# Patient Record
Sex: Female | Born: 1975 | Race: Black or African American | Hispanic: No | Marital: Married | Smoking: Never smoker
Health system: Southern US, Community
[De-identification: ages and names within clinical notes are randomized; demographics above are authoritative.]

## PROBLEM LIST (undated history)

## (undated) DIAGNOSIS — R030 Elevated blood-pressure reading, without diagnosis of hypertension: Secondary | ICD-10-CM

## (undated) DIAGNOSIS — Z8619 Personal history of other infectious and parasitic diseases: Secondary | ICD-10-CM

## (undated) DIAGNOSIS — Z974 Presence of external hearing-aid: Secondary | ICD-10-CM

## (undated) DIAGNOSIS — E559 Vitamin D deficiency, unspecified: Secondary | ICD-10-CM

## (undated) DIAGNOSIS — Z8719 Personal history of other diseases of the digestive system: Secondary | ICD-10-CM

## (undated) DIAGNOSIS — H9190 Unspecified hearing loss, unspecified ear: Secondary | ICD-10-CM

## (undated) DIAGNOSIS — T7840XA Allergy, unspecified, initial encounter: Secondary | ICD-10-CM

## (undated) DIAGNOSIS — K5909 Other constipation: Secondary | ICD-10-CM

## (undated) DIAGNOSIS — Z87442 Personal history of urinary calculi: Secondary | ICD-10-CM

## (undated) DIAGNOSIS — I10 Essential (primary) hypertension: Secondary | ICD-10-CM

## (undated) DIAGNOSIS — N3281 Overactive bladder: Secondary | ICD-10-CM

## (undated) DIAGNOSIS — K5730 Diverticulosis of large intestine without perforation or abscess without bleeding: Secondary | ICD-10-CM

## (undated) DIAGNOSIS — N939 Abnormal uterine and vaginal bleeding, unspecified: Secondary | ICD-10-CM

## (undated) DIAGNOSIS — D250 Submucous leiomyoma of uterus: Secondary | ICD-10-CM

## (undated) DIAGNOSIS — D509 Iron deficiency anemia, unspecified: Secondary | ICD-10-CM

## (undated) HISTORY — DX: Allergy, unspecified, initial encounter: T78.40XA

## (undated) HISTORY — DX: Personal history of other infectious and parasitic diseases: Z86.19

## (undated) HISTORY — DX: Vitamin D deficiency, unspecified: E55.9

## (undated) HISTORY — DX: Elevated blood-pressure reading, without diagnosis of hypertension: R03.0

## (undated) HISTORY — DX: Unspecified hearing loss, unspecified ear: H91.90

---

## 1997-12-01 ENCOUNTER — Encounter: Admission: RE | Admit: 1997-12-01 | Discharge: 1998-03-01 | Payer: Self-pay | Admitting: Gynecology

## 1998-06-08 ENCOUNTER — Inpatient Hospital Stay (HOSPITAL_COMMUNITY): Admission: AD | Admit: 1998-06-08 | Discharge: 1998-06-12 | Payer: Self-pay | Admitting: Gynecology

## 1999-11-09 ENCOUNTER — Other Ambulatory Visit: Admission: RE | Admit: 1999-11-09 | Discharge: 1999-11-09 | Payer: Self-pay | Admitting: Gynecology

## 2000-07-07 ENCOUNTER — Other Ambulatory Visit: Admission: RE | Admit: 2000-07-07 | Discharge: 2000-07-07 | Payer: Self-pay | Admitting: Internal Medicine

## 2002-07-23 ENCOUNTER — Other Ambulatory Visit: Admission: RE | Admit: 2002-07-23 | Discharge: 2002-07-23 | Payer: Self-pay | Admitting: Obstetrics and Gynecology

## 2004-10-04 ENCOUNTER — Other Ambulatory Visit: Admission: RE | Admit: 2004-10-04 | Discharge: 2004-10-04 | Payer: Self-pay | Admitting: Obstetrics and Gynecology

## 2005-03-20 ENCOUNTER — Inpatient Hospital Stay (HOSPITAL_COMMUNITY): Admission: AD | Admit: 2005-03-20 | Discharge: 2005-03-20 | Payer: Self-pay | Admitting: Obstetrics and Gynecology

## 2005-04-15 ENCOUNTER — Inpatient Hospital Stay (HOSPITAL_COMMUNITY): Admission: RE | Admit: 2005-04-15 | Discharge: 2005-04-18 | Payer: Self-pay | Admitting: Obstetrics and Gynecology

## 2005-04-15 ENCOUNTER — Encounter (INDEPENDENT_AMBULATORY_CARE_PROVIDER_SITE_OTHER): Payer: Self-pay | Admitting: Specialist

## 2005-05-12 ENCOUNTER — Inpatient Hospital Stay (HOSPITAL_COMMUNITY): Admission: AD | Admit: 2005-05-12 | Discharge: 2005-05-12 | Payer: Self-pay | Admitting: Obstetrics and Gynecology

## 2009-01-19 ENCOUNTER — Encounter: Admission: RE | Admit: 2009-01-19 | Discharge: 2009-04-06 | Payer: Self-pay | Admitting: Occupational Medicine

## 2010-04-02 LAB — HM PAP SMEAR: HM Pap smear: NORMAL

## 2011-01-18 NOTE — Discharge Summary (Signed)
Michele, [name]              ACCOUNT NO.:  [id number]   MEDICAL RECORD NO.:  [id number]          PATIENT TYPE:  INP   LOCATION:  9132                          FACILITY:  [facility]   PHYSICIAN:  Hal Morales, M.D.DATE OF BIRTH:  [birth year]   DATE OF ADMISSION:  04/15/2005  DATE OF DISCHARGE:  04/18/2005                                 DISCHARGE SUMMARY   ADMITTING DIAGNOSES:  1.  Intrauterine pregnancy at 39 weeks.  2.  Elective repeat cesarean section.   PROCEDURE:  Repeat cesarean section.   DISCHARGE DIAGNOSES:  1.  Intrauterine pregnancy at term, delivered.  2.  Repeat low transverse cesarean section.   HOSPITAL COURSE:  [name] is a 35 year old gravida 2, para 1-0-0-1 at 50  weeks' gestation who presented for repeat C-section.  This was done on  August 14, 2006m with Dr. Osborn Coho as the surgeon.  The patient gave  birth to a 6 pound 10 ounce female infant with Apgar scores of 9 at one  minute and 9 at five minutes.  The patient and infant have done well in the  immediate postpartum.  The patient's vital signs remained stable.  Her  hemoglobin on the first postoperative day was 9.8.  Her incision is clean,  dry and intact, and on this her third postoperative day she is judged to be  in satisfactory condition for discharge.   DISCHARGE INSTRUCTIONS:  Per [facility] handout.  The patient will  make a follow up appointment at [facility]  6 weeks and plans to have IUD inserted  at 3 months postpartum.   DISCHARGE MEDICATIONS:  1.  Motrin 600 mg p.o. q.6 h p.r.n. pain.  2.  Tylox one to two p.o. q.3-4 h pain.  3.  Prenatal vitamins.      [name], C.N.M.      Hal Morales, M.D.  Electronically Signed    SDM/MEDQ  D:  04/18/2005  T:  04/18/2005  Job:  [id number]

## 2011-01-18 NOTE — Op Note (Signed)
NAMESHERRICA, Michele [name]              ACCOUNT NO.:  [id number]   MEDICAL RECORD NO.:  [id number]          PATIENT TYPE:  INP   LOCATION:  9199                          FACILITY:  [facility]   PHYSICIAN:  Osborn Coho, M.D.   DATE OF BIRTH:  20-Mar-1976   DATE OF PROCEDURE:  04/15/2005  DATE OF DISCHARGE:                                 OPERATIVE REPORT   PREOPERATIVE DIAGNOSES:  1.  39 week intrauterine pregnancy.  2.  Elective repeat C section.   POSTOPERATIVE DIAGNOSES:  1.  39 week intrauterine pregnancy.  2.  Elective repeat C section.   PROCEDURE:  Repeat C section via Pfannenstiel skin incision.   SURGEON:  Osborn Coho, M.D.   ASSISTANT:  Elby Showers. Williams, C.N.M.   ANESTHESIA:  Spinal.   SPECIMENS TO PATHOLOGY:  Placenta.   ESTIMATED BLOOD LOSS:  700 mL.   FLUIDS:  3000 mL.   URINE OUTPUT:  200 mL.   COMPLICATIONS:  None.   FINDINGS:  Live female infant with Apgar's of 9 at 1 minute and 9 at 5  minutes, normal appearing bilateral ovaries and fallopian tubes. Three  vessel cord.   DESCRIPTION OF PROCEDURE:  The patient was taken to the operating room after  the risks, benefits, and alternatives were reviewed with the patient and the  patient verbalized understanding, consent signed and witnessed. The patient  was prepped and draped in the normal sterile fashion in the dorsal supine  position with a leftward tilt. A Pfannenstiel skin incision was made at the  site of the prior scar and carried down to the underlying layer of fascia  with the scalpel. The fascia was __________ bilaterally in the midline and  extended bilaterally with the Mayo scissors. Kocher clamps were placed on  the inferior aspect of the fascial incision and the rectus muscle excised  from the fascia. The same was done on the superior aspect of the fascial  incision. The midline was noted with dense adhesions and was taken down  sequentially by tenting up with the hemostatic and excising with the  Bovie  and Mayo scissors. Kocher clamps were placed on the superior aspect of the  fascial incision where the rectus muscle is taken down from the fascia with  dense adhesions noted. The peritoneum was entered with the Metzenbaum  scissors after tenting up. The peritoneum was extended manually. The bladder  blade was placed and a bladder flap created with the Metzenbaum scissors.  The uterine incision was made with the scalpel and extended bilaterally with  the bandage scissors. The infant was delivered and oral and nasal pharynx  bulb suctioned after cord clamped and cut. Clindamycin was administered  secondary to penicillin allergy. The infant was handed to the waiting  pediatricians. The placenta was removed via fundal massage and the uterus  was cleared of all clots and debris. The uterine incision was repaired with  #0 Vicryl in a running locked fashion and a second imbricating layer was  performed. The intraabdominal cavity was copiously irrigated and the adnexal  findings as noted above. The fascia was repaired with #0  Vicryl in a running  fashion. There is an area on the fascia near the umbilicus that appeared  weak and a running stitch of #0 Vicryl was placed for better support. The  subcutaneous tissue was irrigated and reapproximated using 2-0 plain. The  skin was closed with staples. Sponge, lap and needle count was correct. The  patient tolerated the procedure well and is currently being transferred to  the recovery room in good condition.      Osborn Coho, M.D.  Electronically Signed     AR/MEDQ  D:  04/15/2005  T:  04/15/2005  Job:  [id number]

## 2011-01-18 NOTE — H&P (Signed)
[name], [name] NO.:  [id number]   MEDICAL RECORD NO.:  [id number]          PATIENT TYPE:  INP   LOCATION:  NA                            [facility]ILITY:  [facility]   PHYSICIAN:  Osborn Coho, M.D.   DATE OF BIRTH:  [birth year]   DATE OF ADMISSION:  DATE OF DISCHARGE:                                HISTORY & PHYSICAL   HISTORY OF PRESENT ILLNESS:  This is a 35 year old gravida 2, para 1-0-0-1,  at 39-0/7 weeks, who presents for repeat C-section today.  Her pregnancy has  been followed by Dr. Su Hilt and remarkable for:  #1 - Previous C-section,  #2 - penicillin allergy, #3 - ptyalism, #4 - history of anemia.   OBSTETRICAL HISTORY:  OB history is remarkable for low transverse cesarean  section in 1999, normal female infant at 28 weeks' gestation weighing 8  pounds 5 ounces, remarkable for failure to progress and fetal distress.   MEDICAL HISTORY:  Medical history is remarkable for childhood varicella and  history of anemia.   FAMILY HISTORY:  Family history is remarkable for a grandmother with heart  disease, father and mother with hypertension, mother with anemia,  grandmother with diabetes, grandmother with thyroid disease, grandfather  with prostate cancer.   GENETIC HISTORY:  Genetic history is unremarkable, except for a family  history of twins.   SOCIAL HISTORY:  The patient is single.  Father of the baby is Michele [name],  who is involved and supportive.  She does not report a religious  affiliation.  She denies any alcohol, tobacco or drug use.   ALLERGIES:  PENICILLIN causes a rash and swollen tongue.   MEDICATIONS:  Prenatal vitamins.   PERTINENT LABORATORY DATA:  Hemoglobin 11.1, platelets 352,000.  Blood type  O positive, antibody screen negative, sickle cell negative.  RPR  nonreactive.  Rubella immune.  Hepatitis negative.  HIV declined.  Cystic  fibrosis negative.   HISTORY OF CURRENT PREGNANCY:  The patient entered care at 66 weeks'  gestation.  New OB labs were normal.  She had some proteinuria at 15 weeks  and had a 24-hour urine done which showed only 56 mg of protein.  She had an  anatomy scan at 19 weeks which was normal except for they were unable to see  all of the anatomy, and she had another one at 21 weeks which was normal.  She declined her quad screen.  Glucola was normal and the remainder of her  pregnancy was uneventful including a negative group B strep at term.   OBJECTIVE DATA:  VITAL SIGNS:  Stable, afebrile.  HEENT:  Within normal limits.  NECK:  Thyroid normal, not enlarged.  CHEST:  Clear to auscultation.  HEART:  Heart rate regular rate and rhythm.  ABDOMEN:  Gravid at 39 cm, vertex to Leopold's.  Fetal heart rate 150s.  PELVIC:  Exam deferred.  EXTREMITIES:  Within normal limits.   ASSESSMENT:  1.  Intrauterine pregnancy at 39 weeks.  2.  Previous cesarean section.  3.  Desires repeat cesarean section.   PLAN:  Admit to operating suites per  Dr. Su Hilt.      Michele [name], C.N.M.      Osborn Coho, M.D.  Electronically Signed    MLW/MEDQ  D:  04/15/2005  T:  04/15/2005  Job:  [id number]

## 2011-03-27 ENCOUNTER — Ambulatory Visit: Payer: Self-pay

## 2011-10-07 ENCOUNTER — Ambulatory Visit (INDEPENDENT_AMBULATORY_CARE_PROVIDER_SITE_OTHER): Payer: Federal, State, Local not specified - PPO | Admitting: Family

## 2011-10-07 ENCOUNTER — Other Ambulatory Visit: Payer: Self-pay | Admitting: Family

## 2011-10-07 ENCOUNTER — Encounter: Payer: Self-pay | Admitting: Family

## 2011-10-07 VITALS — HR 83 | Temp 97.6°F | Resp 18 | Ht 61.0 in | Wt 205.1 lb

## 2011-10-07 DIAGNOSIS — J329 Chronic sinusitis, unspecified: Secondary | ICD-10-CM

## 2011-10-07 DIAGNOSIS — R61 Generalized hyperhidrosis: Secondary | ICD-10-CM

## 2011-10-07 DIAGNOSIS — R635 Abnormal weight gain: Secondary | ICD-10-CM

## 2011-10-07 DIAGNOSIS — Z0000 Encounter for general adult medical examination without abnormal findings: Secondary | ICD-10-CM

## 2011-10-07 DIAGNOSIS — R030 Elevated blood-pressure reading, without diagnosis of hypertension: Secondary | ICD-10-CM

## 2011-10-07 LAB — CBC
Hemoglobin: 10.6 g/dL — ABNORMAL LOW (ref 12.0–15.0)
MCH: 27.5 pg (ref 26.0–34.0)
MCHC: 31.7 g/dL (ref 30.0–36.0)
MCV: 86.5 fL (ref 78.0–100.0)

## 2011-10-07 MED ORDER — LEVOFLOXACIN 500 MG PO TABS: 500.0000 mg | ORAL_TABLET | Freq: Every day | ORAL | Status: AC

## 2011-10-07 NOTE — Assessment & Plan Note (Signed)
36 yr old female with symptoms consistent with sinusitis. Will plan to treat with levaquin as she has an allergy to penicillin.

## 2011-10-07 NOTE — Assessment & Plan Note (Signed)
New x 6 months, obtain TSH. She reports regular menses.

## 2011-10-07 NOTE — Progress Notes (Signed)
Subjective:    Patient ID: Michele [name], female    DOB: 01/19/1976, 36 y.o.   MRN: [id number]  HPI  Michele [name] is a 36 yr old female who presents today to establish care. She has followed with [facility].   Elevated blood pressure-  She reports that her BP was elevated early January.  She reports that her SBP has been averaging 136 at work.    HA- started 2 weeks ago.  "pressure behind her eyeballs, across forehead." She reports that symptoms are intermittent.  Throbbing.   Denies nasal congestion.  Night sweats- she has gained 20 pounds this year.  Started 3rd shift.  She joined the gym last week at work.  Wants to exercise 3 nights a week on her lunch break.  She reports that this has been going on for 6 months.  She reports that she has mirena IUD.  She reports that the last 2 yrs has been regular.   Weight gain- has gained 20 pounds the last 1 yr.  Review of Systems  Constitutional: Positive for unexpected weight change.  HENT: Negative for hearing loss.   Eyes: Negative for visual disturbance.  Respiratory: Negative for cough and shortness of breath.   Cardiovascular: Negative for chest pain and leg swelling.  Gastrointestinal: Negative for nausea, vomiting and diarrhea.  Genitourinary: Negative for menstrual problem.  Musculoskeletal: Negative for myalgias and arthralgias.  Skin: Negative for rash.  Neurological: Positive for headaches.  Hematological: Negative for adenopathy.  Psychiatric/Behavioral:       Felt claustraphobic last week while on airplane.    Past Medical History  Diagnosis Date  . Allergy   . Elevated blood pressure reading without diagnosis of hypertension     History   Social History  . Marital Status: Married    Spouse Name: N/A    Number of Children: 2  . Years of Education: N/A   Occupational History  .     Social History Main Topics  . Smoking status: Never Smoker   . Smokeless tobacco: Never Used  . Alcohol Use: Yes     1 drink  every other week  . Drug Use: No  . Sexually Active: Not on file   Other Topics Concern  . 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    Past Surgical History  Procedure Date  . Cesarean section 06/09/98 & 04/15/05    x 2    Family History  Problem Relation Age of Onset  . Hypertension Mother   . Hypertension Father   . Hypertension Sister   . Hypertension Maternal Grandmother   . Diabetes Maternal Grandmother   . Hypertension Paternal Grandmother   . Hypertension Paternal Grandfather     Allergies  Allergen Reactions  . Penicillins Other (See Comments)    Rash/bumps in mouth    No current outpatient prescriptions on file prior to visit.    Pulse 83  Temp(Src) 97.6 F (36.4 C) (Oral)  Resp 18  Ht 5\' 1"  (1.549 m)  Wt 205 lb 1.3 oz (93.024 kg)  BMI 38.75 kg/m2  SpO2 100%  LMP 09/25/2011       Objective:   Physical Exam  Constitutional: She is oriented to person, place, and time.       Pleasant, overweight AA female, NAD  HENT:  Head: Normocephalic and atraumatic.  Right Ear: Tympanic membrane and ear canal normal.  Left Ear: Tympanic membrane and ear canal normal.  Mouth/Throat: No posterior oropharyngeal edema or posterior oropharyngeal erythema.       + frontal sinus tenderness to palpation  Eyes: Conjunctivae are normal. Pupils are equal, round, and reactive to light.  Neck: Normal range of motion. Neck supple. No thyromegaly present.  Cardiovascular: Normal rate and regular rhythm.   No murmur heard. Pulmonary/Chest: Effort normal and breath sounds normal. No respiratory distress. She has no wheezes. She has no rales. She exhibits no tenderness.  Musculoskeletal: She exhibits no edema.  Neurological: She is alert and oriented to person, place, and time.  Skin:  Skin is warm and dry.  Psychiatric: She has a normal mood and affect. Her behavior is normal. Judgment and thought content normal.          Assessment & Plan:

## 2011-10-07 NOTE — Assessment & Plan Note (Signed)
Will obtain TSH.  We discussed exercise and weight loss today.

## 2011-10-07 NOTE — Assessment & Plan Note (Signed)
BP was hard to hear today.  CMA unable to obtain.  I was able to palpate systolic pressure 142 but could not hear diastolic.  Recommended low sodium diet and will plan to repeat in 1 month.

## 2011-10-07 NOTE — Patient Instructions (Signed)
Please complete your blood work today. Call if headache worsens or if it does not improve.  Please follow up in 1 month for a complete physical. Come fasting to this appointment.  Welcome to [facility]!

## 2011-10-08 ENCOUNTER — Encounter: Payer: Self-pay | Admitting: Family

## 2011-10-08 LAB — BASIC METABOLIC PANEL WITH GFR
BUN: 11 mg/dL (ref 6–23)
Calcium: 9.8 mg/dL (ref 8.4–10.5)
Creat: 0.53 mg/dL (ref 0.50–1.10)
GFR, Est African American: >89 mL/min
Glucose, Bld: 81 mg/dL (ref 70–99)
Sodium: 136 mEq/L (ref 135–145)

## 2011-10-08 LAB — FERRITIN: Ferritin: 22 ng/mL (ref 10–291)

## 2011-10-08 LAB — HEPATIC FUNCTION PANEL
ALT: 13 U/L (ref 0–35)
AST: 19 U/L (ref 0–37)
Albumin: 4.4 g/dL (ref 3.5–5.2)
Bilirubin, Direct: 0.1 mg/dL (ref 0.0–0.3)
Total Protein: 7.6 g/dL (ref 6.0–8.3)

## 2011-10-08 LAB — IRON AND TIBC
%SAT: 9 % — ABNORMAL LOW (ref 20–55)
TIBC: 389 ug/dL (ref 250–470)
UIBC: 353 ug/dL (ref 125–400)

## 2011-10-08 LAB — LIPID PANEL
Cholesterol: 171 mg/dL (ref 0–200)
Triglycerides: 45 mg/dL (ref <150)

## 2011-10-08 LAB — FOLATE: Folate: 12 ng/mL

## 2011-10-08 LAB — VITAMIN B12: Vitamin B-12: 358 pg/mL (ref 211–911)

## 2011-10-09 ENCOUNTER — Telehealth: Payer: Self-pay | Admitting: Family

## 2011-10-09 MED ORDER — FERROUS SULFATE 325 (65 FE) MG PO TABS: 325.0000 mg | ORAL_TABLET | Freq: Every day | ORAL | Status: DC

## 2011-10-09 NOTE — Telephone Encounter (Signed)
Left detailed message on voicemail and to call if any questions. 

## 2011-10-09 NOTE — Telephone Encounter (Signed)
Please call patient and let her know that she is anemic and iron is low.  I would like her to add iron supplement as noted in meds. Other labs are ok.

## 2011-10-29 ENCOUNTER — Ambulatory Visit: Payer: Self-pay | Admitting: Registered Nurse

## 2011-11-04 ENCOUNTER — Ambulatory Visit: Payer: Federal, State, Local not specified - PPO | Admitting: Family

## 2012-05-15 ENCOUNTER — Telehealth: Payer: Self-pay | Admitting: Obstetrics and Gynecology

## 2012-05-15 NOTE — Telephone Encounter (Signed)
TC to pt. States has had vaginal burning and itching x 1 week.  Also has numbness on left hip when she presses it. States had same sx previously with BV. Denies UTI sx. Sched for eval with EP 05/18/12.  To call after hours or be seen at [facility] if sx increase. Pt verbalizes comprehension.

## 2012-05-18 ENCOUNTER — Encounter: Payer: Self-pay | Admitting: Obstetrics and Gynecology

## 2012-05-18 ENCOUNTER — Ambulatory Visit (INDEPENDENT_AMBULATORY_CARE_PROVIDER_SITE_OTHER): Payer: Federal, State, Local not specified - PPO | Admitting: Obstetrics and Gynecology

## 2012-05-18 VITALS — BP 140/80 | Wt 204.0 lb

## 2012-05-18 DIAGNOSIS — N898 Other specified noninflammatory disorders of vagina: Secondary | ICD-10-CM

## 2012-05-18 DIAGNOSIS — N390 Urinary tract infection, site not specified: Secondary | ICD-10-CM

## 2012-05-18 DIAGNOSIS — Z309 Encounter for contraceptive management, unspecified: Secondary | ICD-10-CM

## 2012-05-18 DIAGNOSIS — IMO0001 Reserved for inherently not codable concepts without codable children: Secondary | ICD-10-CM

## 2012-05-18 LAB — POCT URINALYSIS DIPSTICK
Ketones, UA: NEGATIVE
Protein, UA: NEGATIVE
Spec Grav, UA: 1.015
Urobilinogen, UA: NEGATIVE
pH, UA: 7

## 2012-05-18 LAB — POCT WET PREP (WET MOUNT)
Trichomonas Wet Prep HPF POC: NEGATIVE
pH: 4.5

## 2012-05-18 MED ORDER — FLUCONAZOLE 150 MG PO TABS: 150.0000 mg | ORAL_TABLET | Freq: Once | ORAL | Status: DC

## 2012-05-18 MED ORDER — CIPROFLOXACIN 500 MG/5ML (10%) PO SUSR: 500.0000 mg | Freq: Two times a day (BID) | ORAL | Status: DC

## 2012-05-18 NOTE — Patient Instructions (Signed)

## 2012-05-18 NOTE — Progress Notes (Signed)
35 YO complains of vaginitis symptoms and dysuria x 1 week.  Admits to holding urine, not consuming much water and sitting around in wet swim suit. Denies fever, flank pain, nausea, vomiting, vaginal itching or odor.  O: Pelvic: EGBUS-wnl, vagina-small thin white discharge, cervix/uterus-normal,  adnexae-no masses or tenderness      Abdomen: soft, non-tender  Wet Prep:  pH-4.5,  whiff-negative, no clue cells, yeast or trichomonas U/A:   pH-7.0,  SG-1.015,  blood-1+, leukocytes-2+ UPT-negative  A: UTI  P: Bladder hygiene      Cipro Suspension 500 mg/5 ml  #100 mL  5 mL bid         Diflucan 150mg  #1 1 po stat 1 refill (yeast prophalaxis)      RTO-AEx  Jaking Thayer, PA-C

## 2012-05-18 NOTE — Progress Notes (Signed)
Vag. Discharge:YES Odor:no Fever:no Irreg.Periods:no Dyspareunia:no Dysuria:yes Frequency:no Urgency:no Hematuria:no Kidney stones:no Constipation:no Diarrhea:no Rectal Bleeding: no Vomiting:no Nausea:no Pregnant:no Fibroids:no Endometriosis:no Hx of Ovarian Cyst:no Hx IUD:yes Hx STD-PID:no Appendectomy:no Gall Bladder Dz:no

## 2012-05-20 LAB — URINE CULTURE

## 2012-06-05 ENCOUNTER — Encounter: Payer: Federal, State, Local not specified - PPO | Admitting: Obstetrics and Gynecology

## 2012-09-23 ENCOUNTER — Encounter: Payer: Self-pay | Admitting: Obstetrics and Gynecology

## 2012-09-23 ENCOUNTER — Ambulatory Visit: Payer: Federal, State, Local not specified - PPO | Admitting: Obstetrics and Gynecology

## 2012-09-23 ENCOUNTER — Telehealth: Payer: Self-pay | Admitting: Obstetrics and Gynecology

## 2012-09-23 VITALS — BP 140/86 | HR 82 | Ht 61.0 in | Wt 200.0 lb

## 2012-09-23 DIAGNOSIS — N898 Other specified noninflammatory disorders of vagina: Secondary | ICD-10-CM

## 2012-09-23 DIAGNOSIS — Z01419 Encounter for gynecological examination (general) (routine) without abnormal findings: Secondary | ICD-10-CM

## 2012-09-23 DIAGNOSIS — Z113 Encounter for screening for infections with a predominantly sexual mode of transmission: Secondary | ICD-10-CM

## 2012-09-23 DIAGNOSIS — E559 Vitamin D deficiency, unspecified: Secondary | ICD-10-CM

## 2012-09-23 DIAGNOSIS — Z1321 Encounter for screening for nutritional disorder: Secondary | ICD-10-CM

## 2012-09-23 DIAGNOSIS — Z124 Encounter for screening for malignant neoplasm of cervix: Secondary | ICD-10-CM

## 2012-09-23 LAB — POCT WET PREP (WET MOUNT)
Whiff Test: POSITIVE
pH: 5

## 2012-09-23 MED ORDER — ALPRAZOLAM 0.5 MG PO TABS: ORAL_TABLET | ORAL | Status: DC

## 2012-09-23 MED ORDER — FLUCONAZOLE 150 MG PO TABS: 150.0000 mg | ORAL_TABLET | Freq: Once | ORAL | Status: DC

## 2012-09-23 MED ORDER — METRONIDAZOLE 500 MG PO TABS: ORAL_TABLET | ORAL | Status: DC

## 2012-09-23 NOTE — Progress Notes (Signed)
Regular Periods: yes Mammogram: no  Monthly Breast Ex.: yes Exercise: yes  Tetanus < 10 years: yes Seatbelts: yes  NI. Bladder Functn.: yes Abuse at home: no  Daily BM's: yes Stressful Work: no  Healthy Diet: yes Sigmoid-Colonoscopy: ON  Calcium: no Medical problems this year: CHECK FOR YEAST   LAST PAP:2008  Contraception: LO LOESTRIN  Mammogram:  NO  PCP:  DR. Peggyann Juba  PMH: NO CHANGE  FMH: NO CHANGE  Last Bone Scan: NO  PT IS MARRIED.

## 2012-09-23 NOTE — Progress Notes (Signed)
Subjective:    Michele [name] is a 37 y.o. female, G2P0, who presents for an annual exam. The patient reports vaginitis symptoms and wants to be checked for yeast and trichomonas. Has been having some itching with vaginal odor. Patient states that she does not use much salt but has been rushing around this a.m. and is having lots of stress at work.   Menstrual cycle:   LMP: Patient's last menstrual period was 09/07/2012.             Review of Systems Pertinent items are noted in HPI. Denies pelvic pain, urinary tract symptoms, irregular bleeding, menopausal symptoms, change in bowel habits or rectal bleeding   Objective:    BP 140/86  Pulse 82  Ht 5\' 1"  (1.549 m)  Wt 200 lb (90.719 kg)  BMI 37.79 kg/m2  LMP 09/07/2012   Wt Readings from Last 1 Encounters:  09/23/12 200 lb (90.719 kg)   Body mass index is 37.79 kg/(m^2). General Appearance: Alert, no acute distress HEENT: Grossly normal Neck / Thyroid: Supple, no thyromegaly or cervical adenopathy Lungs: Clear to auscultation bilaterally Back: No CVA tenderness Breast Exam: No masses or nodes.No dimpling, nipple retraction or discharge. Cardiovascular: Regular rate and rhythm.  Gastrointestinal: Soft, non-tender, no masses or organomegaly Pelvic Exam: EGBUS-wnl, vagina-normal rugae, copious malodorous creamy discharge, cervix- without lesions or tenderness, uterus appears normal size shape and consistency, adnexae-no masses or tenderness Lymphatic Exam: Non-palpable nodes in neck, clavicular,  axillary, or inguinal regions  Skin: no rashes or abnormalities Extremities: no clubbing cyanosis or edema  Neurologic: grossly normal Psychiatric: Alert and oriented  Wet Prep: ph-5.0, whiff-positive, few clue cells OSOM trich-negative   Assessment:   Routine GYN Exam El;evated Bp Bacterial Vaginosis   Plan:  Patient considering a return to Mirena; IUD information sheet given Patient has anxiety about the pain she experienced  with two previous IUD insertions.  Given Cytotec 200 mcg #2  2 pv 4 hours before procedure along with pre procedure snack/NSAID and Xanax 0.5 mg 1 hour beforehand.    To have someone bring her and pick her up on day of insertion.  Metronidazole 500 mg #26 bid x 7 days the once a week x 12 weeks Diflucan 150 mg  #1 1 po stat 2 refills  Continue BCPs until insertion  STD testing-pending  PAP sent;  Perineal Hygiene  RTO 1 year or prn  Antoinetta Berrones,ELMIRAPA-C

## 2012-09-23 NOTE — Telephone Encounter (Signed)
Patient left without printed prescription.  Called in Xanax 0.5 mg  #2 1 po 1 hour before procedure no refills.  Jeison Delpilar, PA-C

## 2012-09-23 NOTE — Patient Instructions (Signed)
Avoid: - excess soap on genital area (consider using plain oatmeal soap) - use of powder or sprays in genital area - douching - wearing underwear to bed (except with menses) - using more than is directed detergent when washing clothes - tight fitting garments around genital area - excess sugar intake   

## 2012-09-24 ENCOUNTER — Telehealth: Payer: Self-pay

## 2012-09-24 ENCOUNTER — Other Ambulatory Visit: Payer: Self-pay

## 2012-09-24 DIAGNOSIS — E559 Vitamin D deficiency, unspecified: Secondary | ICD-10-CM

## 2012-09-24 LAB — PAP IG, CT-NG, RFX HPV ASCU: GC Probe Amp: NEGATIVE

## 2012-09-24 LAB — VITAMIN D 25 HYDROXY (VIT D DEFICIENCY, FRACTURES): Vit D, 25-Hydroxy: 11 ng/mL — ABNORMAL LOW (ref 30–89)

## 2012-09-24 MED ORDER — VITAMIN D (ERGOCALCIFEROL) 1.25 MG (50000 UNIT) PO CAPS: ORAL_CAPSULE | ORAL | Status: DC

## 2012-09-24 NOTE — Telephone Encounter (Signed)
Message copied by Winfred Leeds on Thu Sep 24, 2012 10:28 AM ------      Message from: Henreitta Leber      Created: Thu Sep 24, 2012  7:16 AM       Please initiate the Vitamin D Protocol for this patient. Remind her that once it is completed, she'll need to take Vitamin D 1000 IU daily to maintain a normal level.   Thank you,  EP

## 2012-09-24 NOTE — Telephone Encounter (Signed)
TC TO PT REGARDING VIT D LEVEL. INFORMED PT THAT HER LEVEL WAS LOW AND WE NEED TO CALL IN RX FOR PT. WENT OVER VIT D PROTOCOL AND WILL CALL RX IN TO PT PHARMACY. ALSO TOLD PT I WILL PUT HER ON THE RECALL LIST TO SCHEDULE A LAB VISIT IN 8 WEEKS TO RECHECK. PT VOICED UNDERSTANDING.

## 2012-09-24 NOTE — Telephone Encounter (Signed)
Lm for pt to call back

## 2012-10-17 ENCOUNTER — Other Ambulatory Visit: Payer: Self-pay

## 2012-10-20 ENCOUNTER — Telehealth: Payer: Self-pay | Admitting: Obstetrics and Gynecology

## 2012-10-20 NOTE — Telephone Encounter (Signed)
Spoke with pt rgd msg pt wants lab results informed rpr, gc/ct neg pt voice understanding

## 2012-10-29 ENCOUNTER — Encounter: Payer: Self-pay | Admitting: Obstetrics and Gynecology

## 2012-11-03 ENCOUNTER — Telehealth: Payer: Self-pay | Admitting: Obstetrics and Gynecology

## 2012-11-03 ENCOUNTER — Encounter: Payer: Federal, State, Local not specified - PPO | Admitting: Obstetrics and Gynecology

## 2012-11-03 NOTE — Telephone Encounter (Signed)
Tc to pt regarding msg.  Pt states that she has an appt today for IUD insertion, but she had taken the Plan B yesterday and wants to know if she can still have the IUD inserted.  Consulted w/ EP, informed pt she will need to reschedule her appt, she can either wait 14 days and get it done then if negative pregnancy or she can wait five days, get a qhcg in the am and if negative can have the IUD inserted that afternoon.  Pt states she will wait the 14 days.

## 2012-11-03 NOTE — Telephone Encounter (Signed)
(  cont'd from previous msg):  Pt scheduled for insertion on Tuesday 11/17/12 @ 1415 w/ EP, pt voices agreement.

## 2013-07-08 ENCOUNTER — Other Ambulatory Visit: Payer: Self-pay

## 2014-07-04 ENCOUNTER — Encounter: Payer: Self-pay | Admitting: Obstetrics and Gynecology

## 2014-09-10 ENCOUNTER — Encounter: Payer: Self-pay | Admitting: Podiatry

## 2014-09-10 ENCOUNTER — Ambulatory Visit (INDEPENDENT_AMBULATORY_CARE_PROVIDER_SITE_OTHER): Payer: Federal, State, Local not specified - PPO | Admitting: Podiatry

## 2014-09-10 VITALS — BP 144/92 | HR 76 | Resp 18

## 2014-09-10 DIAGNOSIS — B351 Tinea unguium: Secondary | ICD-10-CM

## 2014-09-10 DIAGNOSIS — B353 Tinea pedis: Secondary | ICD-10-CM

## 2014-09-10 MED ORDER — NAFTIFINE HCL 2 % EX GEL: 1.0000 "application " | Freq: Every day | CUTANEOUS | Status: DC

## 2014-09-10 MED ORDER — TAVABOROLE 5 % EX SOLN: 1.0000 [drp] | CUTANEOUS | Status: DC

## 2014-09-10 NOTE — Progress Notes (Signed)
   Subjective:    Patient ID: Michele LunaNatasha Y [name], female    DOB: Jul 22, 1976, 39 y.o.   MRN: [id number]  HPI  39 year old female presents the office today with complaints of possible athlete's foot both of her feet as well as for thick toenails for which she is concerned about nail fungus. She states that over the summer she started to notice some dryness to her feet which itched. She states that she has severely dry skin to the bottom of her feet which will flake off. She states that intermittently there is some redness underlying the dry, scaly areas. Other than over-the-counter moisturizer she's had no other treatments. Overall last several months she started to notice that her right big toenail has become thickened and discolored. She's had no prior treatments for this. Previously Lamisil have been discussed with her however she did not want to undergo oral treatment. Denies any history of injury or trauma to bilateral lower extremities recently. No other complaints at this time.    Review of Systems  Skin:       Change in nails  All other systems reviewed and are negative.      Objective:   Physical Exam AAO 3, NAD DP/PT pulses palpable bilaterally, CRT less than 3 seconds Protective sensation intact with SWMF, vibratory sensation intact, Achilles tendon reflex intact. Bilateral hallux and fifth digit nails are hypertrophic, dystrophic, elongated, brittle, discolored. No surrounding erythema or drainage from the nail sites. Dry, peeling, scaly skin to bilateral plantar feet and interdigitally. There is slight amount of underlying erythema interdigitally consistent with tinea pedis. No open lesions or pre-ulcer lesions identified. No areas of pinpoint bony tenderness or pain with vibratory sensation bilateral options. No overlying edema, erythema, increased warmth bilaterally. No pain with calf compression, swelling, warmth, erythema.     Assessment & Plan:  39 year old female with  bilateral hallux/fifth digit likely onychomycosis, tinea pedis/xerosis.  -Treatment options were discussed the patient including alternatives, risks, complications. -For onychomycosis discussed various treatment options. At this time the patient does not want to proceed with any oral therapy would like topical treatment. Jodi GeraldsKerydin was prescribed and the prescription was sent [facility]. Patient was given follow-up information to follow-up at the pharmacy. Discussed the patient had applied the medication as well as side effects. Discussed she is to discontinue medication call the office should any occur. -Nail sharply debrided without complications/bleeding to patient comfort without,.  -For possible tinea pedis/xerosis will start with an antifungal help clear up any underlying fungal infection. Prescribed Naftin. If antifungal does not clear up the area will switch to urea 40% for moisturizer. Continue to monitor the area. -Follow-up as needed. In the meantime, encouraged to call the office with any questions, concerns, change in symptoms.

## 2014-09-10 NOTE — Patient Instructions (Signed)

## 2014-09-15 ENCOUNTER — Telehealth: Payer: Self-pay | Admitting: *Deleted

## 2014-09-15 NOTE — Telephone Encounter (Signed)
"  I'm calling in regarding my visit this past Saturday, regarding a prescription the doctor sent to a home delivery type company.  I've got a question.  They called me yesterday and said they would have to contact my health insurance and get some type of approval.  They stated 9 times out of 10 the approval would be denied and I would have to pay them for the prescription.  I just want to make sure this is correct.  If someone would give me a call back, thanks.

## 2014-09-16 NOTE — Telephone Encounter (Signed)
I'm returning your call.  Yes, [facility] will check your insurance for you to see if the medication is covered.  They will call and let you know if it is covered.  "They told me that most likely my insurance will not cover it and said they could give me a special offer of $70 for a bottle.  Is this true?"  Yes, that is what they offer.  "Should I go ahead and pay for it and get started or should I wait on the insurance denial?"  Wait until you get the denial.  "Okay, thank you."

## 2014-09-20 ENCOUNTER — Telehealth: Payer: Self-pay | Admitting: *Deleted

## 2014-09-20 NOTE — Telephone Encounter (Signed)
Authorization request sent for Michele [name].

## 2014-09-22 NOTE — Telephone Encounter (Signed)
[place] - [facility] [phone] called to check on status of Kerydin pre-cert.

## 2014-10-10 NOTE — Telephone Encounter (Signed)
[facility] [employer] [facility] and [facility]ed [facility] to them.

## 2014-10-20 ENCOUNTER — Other Ambulatory Visit: Payer: Self-pay | Admitting: Podiatry

## 2014-10-20 MED ORDER — EFINACONAZOLE 10 % EX SOLN: 8.0000 mL | Freq: Every day | CUTANEOUS | Status: DC

## 2014-10-20 MED ORDER — EFINACONAZOLE 10 % EX SOLN: 1.0000 [drp] | Freq: Every day | CUTANEOUS | Status: DC

## 2015-09-03 HISTORY — PX: HYSTEROSCOPY: SHX211

## 2018-03-02 DIAGNOSIS — H9190 Unspecified hearing loss, unspecified ear: Secondary | ICD-10-CM

## 2018-03-02 HISTORY — DX: Unspecified hearing loss, unspecified ear: H91.90

## 2018-04-06 ENCOUNTER — Encounter (HOSPITAL_COMMUNITY): Payer: Self-pay

## 2018-04-06 ENCOUNTER — Emergency Department (HOSPITAL_COMMUNITY)
Admission: EM | Admit: 2018-04-06 | Discharge: 2018-04-06 | Disposition: A | Discharge status: Home / Self Care | Payer: Federal, State, Local not specified - PPO | Attending: Emergency Medicine | Admitting: Emergency Medicine

## 2018-04-06 ENCOUNTER — Other Ambulatory Visit: Payer: Self-pay

## 2018-04-06 DIAGNOSIS — H8301 Labyrinthitis, right ear: Secondary | ICD-10-CM | POA: Diagnosis not present

## 2018-04-06 DIAGNOSIS — Z79899 Other long term (current) drug therapy: Secondary | ICD-10-CM | POA: Diagnosis not present

## 2018-04-06 DIAGNOSIS — H9201 Otalgia, right ear: Secondary | ICD-10-CM | POA: Diagnosis present

## 2018-04-06 LAB — URINALYSIS, ROUTINE W REFLEX MICROSCOPIC
Bilirubin Urine: NEGATIVE
Glucose, UA: NEGATIVE mg/dL
Ketones, ur: NEGATIVE mg/dL
Nitrite: NEGATIVE
Protein, ur: NEGATIVE mg/dL
Specific Gravity, Urine: 1.029 (ref 1.005–1.030)
pH: 6 (ref 5.0–8.0)

## 2018-04-06 LAB — BASIC METABOLIC PANEL
Anion gap: 12 (ref 5–15)
BUN: 17 mg/dL (ref 6–20)
CO2: 24 mmol/L (ref 22–32)
Calcium: 9.1 mg/dL (ref 8.9–10.3)
Chloride: 102 mmol/L (ref 98–111)
Creatinine, Ser: 0.71 mg/dL (ref 0.44–1.00)
GFR calc Af Amer: >60 mL/min (ref >60)
GFR calc non Af Amer: >60 mL/min (ref >60)
Glucose, Bld: 118 mg/dL — ABNORMAL HIGH (ref 70–99)
Potassium: 3.7 mmol/L (ref 3.5–5.1)
Sodium: 138 mmol/L (ref 135–145)

## 2018-04-06 LAB — CBC
HCT: 35.2 % — ABNORMAL LOW (ref 36.0–46.0)
Hemoglobin: 11.5 g/dL — ABNORMAL LOW (ref 12.0–15.0)
MCH: 27.1 pg (ref 26.0–34.0)
MCHC: 32.7 g/dL (ref 30.0–36.0)
MCV: 83 fL (ref 78.0–100.0)
Platelets: 667 10*3/uL — ABNORMAL HIGH (ref 150–400)
RBC: 4.24 MIL/uL (ref 3.87–5.11)
RDW: 16.8 % — ABNORMAL HIGH (ref 11.5–15.5)
WBC: 19.9 10*3/uL — ABNORMAL HIGH (ref 4.0–10.5)

## 2018-04-06 LAB — I-STAT BETA HCG BLOOD, ED (MC, WL, AP ONLY): I-stat hCG, quantitative: <5 m[IU]/mL (ref <5)

## 2018-04-06 LAB — CBG MONITORING, ED: Glucose-Capillary: 109 mg/dL — ABNORMAL HIGH (ref 70–99)

## 2018-04-06 MED ORDER — TRAMADOL HCL 50 MG PO TABS: 50.0000 mg | ORAL_TABLET | Freq: Three times a day (TID) | ORAL | 0 refills | Status: DC | PRN

## 2018-04-06 MED ORDER — MECLIZINE HCL 25 MG PO TABS: 25.0000 mg | ORAL_TABLET | Freq: Three times a day (TID) | ORAL | 0 refills | Status: DC | PRN

## 2018-04-06 NOTE — ED Triage Notes (Signed)
Pt states since about a week ago, she started having fullness in her ear. Pt saw PCP on Monday, who said her ears were clear. Pt went to an ENT, where her ears looked good there. Pt states that she was dx with severe hearing loss in right ear. Pt states that she was placed on prednisone. Pt states she is having dizziness, pain, and pressure as well.

## 2018-04-06 NOTE — Discharge Instructions (Addendum)
Take meclizine as needed for dizziness, nausea, etc. Continue prednisone as prescribed. Follow-up with ENT. Your blood pressure in the ER today was high. I would like for you to keep a log of it and take it with you when you see your PCP next.

## 2018-04-09 NOTE — ED Provider Notes (Signed)
[facility]-EMERGENCY DEPT Provider Note   CSN: [id number] Arrival date & time: 04/06/18  1109     History   Chief Complaint Chief Complaint  Patient presents with  . Dizziness  . Hearing Problem    HPI Michele [name] is a 42 y.o. female.  HPI  42 year old female with right ear pressure, ringing, feeling off balance and hearing loss.  Symptom onset about a week ago.  Persistent since then.  She describes feeling very off balance with sitting up and movement.  She feels like her ear is clogged/full.  Occasional ringing.  No fevers or chills.  No drainage.  She is evaluated by ENT and her PCP for the same complaint.  She is currently on prednisone.  Past Medical History:  Diagnosis Date  . Allergy   . Elevated blood pressure reading without diagnosis of hypertension   . Vitamin D deficiency     Patient Active Problem List   Diagnosis Date Noted  . Sinusitis 10/07/2011  . Weight gain 10/07/2011  . Night sweats 10/07/2011  . Elevated blood pressure 10/07/2011    Past Surgical History:  Procedure Laterality Date  . CESAREAN SECTION  06/09/98 & 04/15/05   x 2     OB History    Gravida  2   Para  2   Term  2   Preterm      AB      Living  2     SAB      TAB      Ectopic      Multiple      Live Births               Home Medications    Prior to Admission medications   Medication Sig Start Date End Date Taking? Authorizing Provider  JUNEL FE 1.5/30 1.5-30 MG-MCG tablet Take 1 tablet by mouth daily. 02/19/18  Yes [provider]  LORazepam (ATIVAN) 1 MG tablet Take 0.5 mg by mouth daily as needed. 03/30/18  Yes [provider]  predniSONE (DELTASONE) 20 MG tablet Take 20 mg by mouth 3 (three) times daily. For 2 weeks 04/02/18  Yes [provider]  ALPRAZolam Prudy Feeler(XANAX) 0.5 MG tablet Take 1 tablet 1 hour before procedure Patient not taking: Reported on 09/10/2014 09/23/12   Henreitta LeberPowell, [place], PA-C  Efinaconazole  10 % SOLN Apply 1 drop topically daily. Patient not taking: Reported on 04/06/2018 10/20/14   Vivi BarrackWagoner, Matthew R, DPM  ferrous sulfate 325 (65 FE) MG tablet Take 1 tablet (325 mg total) by mouth daily with breakfast. 10/09/11 10/08/12  Sandford Craze'Sullivan, Melissa, NP  fluconazole (DIFLUCAN) 150 MG tablet Take 1 tablet (150 mg total) by mouth once. Patient not taking: Reported on 09/10/2014 09/23/12   Henreitta LeberPowell, [place], PA-C  meclizine (ANTIVERT) 25 MG tablet Take 1 tablet (25 mg total) by mouth 3 (three) times daily as needed for dizziness. 04/06/18   Raeford RazorKohut, Alyscia Carmon, MD  metroNIDAZOLE (FLAGYL) 500 MG tablet 1 po pc bix x 7 days then 1 tablet weekly x 12 weeks Patient not taking: Reported on 09/10/2014 09/23/12   Henreitta LeberPowell, [place], PA-C  Naftifine HCl (NAFTIN) 2 % GEL Apply 1 application topically daily. Patient not taking: Reported on 04/06/2018 09/10/14   Vivi BarrackWagoner, Matthew R, DPM  Tavaborole (KERYDIN) 5 % SOLN Apply 1 drop topically 1 day or 1 dose. Apply 1 drop to the toenail daily. Patient not taking: Reported on 04/06/2018 09/10/14   Vivi BarrackWagoner, Matthew R, DPM  traMADol (  ULTRAM) 50 MG tablet Take 1 tablet (50 mg total) by mouth every 8 (eight) hours as needed. 04/06/18   Raeford Razor, MD  Vitamin D, Ergocalciferol, (DRISDOL) 50000 UNITS CAPS TAKE ONE CAPSULE TWICE A WEEK FOR EIGHT WEEKS. Patient not taking: Reported on 04/06/2018 09/24/12   Henreitta Leber, PA-C    Family History Family History  Problem Relation Age of Onset  . Hypertension Mother   . Hypertension Father   . Hypertension Maternal Grandmother   . Diabetes Maternal Grandmother   . Hypertension Paternal Grandmother   . Hypertension Paternal Grandfather   . Hypertension Sister     Social History Social History   Tobacco Use  . Smoking status: Never Smoker  . Smokeless tobacco: Never Used  Substance Use Topics  . Alcohol use: Yes    Comment: 1 drink every other week  . Drug use: No     Allergies   Naproxen and Penicillins   Review of Systems Review  of Systems  All systems reviewed and negative, other than as noted in HPI.  Physical Exam Updated Vital Signs BP (!) 194/110   Pulse 88   Temp 98.1 F (36.7 C) (Oral)   Resp 14   Ht 5\' 1"  (1.549 m)   Wt 90.7 kg   LMP 03/10/2018   SpO2 100%   BMI 37.79 kg/m   Physical Exam  Constitutional: She appears well-developed and well-nourished. No distress.  HENT:  Head: Normocephalic and atraumatic.  Right Ear: External ear normal.  Left Ear: External ear normal.  TMs clear bilaterally  Eyes: Conjunctivae are normal. Right eye exhibits no discharge. Left eye exhibits no discharge.  Neck: Neck supple.  Cardiovascular: Normal rate, regular rhythm and normal heart sounds. Exam reveals no gallop and no friction rub.  No murmur heard. Pulmonary/Chest: Effort normal and breath sounds normal. No respiratory distress.  Abdominal: Soft. She exhibits no distension. There is no tenderness.  Musculoskeletal: She exhibits no edema or tenderness.  Neurological: She is alert. No cranial nerve deficit. She exhibits normal muscle tone.  Good finger-nose testing bilaterally.  Skin: Skin is warm and dry.  Psychiatric: She has a normal mood and affect. Her behavior is normal. Thought content normal.  Nursing note and vitals reviewed.    ED Treatments / Results  Labs (all labs ordered are listed, but only abnormal results are displayed) Labs Reviewed  BASIC METABOLIC PANEL - Abnormal; Notable for the following components:      Result Value   Glucose, Bld 118 (*)    All other components within normal limits  CBC - Abnormal; Notable for the following components:   WBC 19.9 (*)    Hemoglobin 11.5 (*)    HCT 35.2 (*)    RDW 16.8 (*)    Platelets 667 (*)    All other components within normal limits  URINALYSIS, ROUTINE W REFLEX MICROSCOPIC - Abnormal; Notable for the following components:   APPearance CLOUDY (*)    Hgb urine dipstick MODERATE (*)    Leukocytes, UA LARGE (*)    Bacteria, UA  FEW (*)    All other components within normal limits  CBG MONITORING, ED - Abnormal; Notable for the following components:   Glucose-Capillary 109 (*)    All other components within normal limits  I-STAT BETA HCG BLOOD, ED ([facility], [facility], [facility] ONLY)    EKG EKG Interpretation  Date/Time:  Monday April 06 2018 11:23:22 EDT Ventricular Rate:  69 PR Interval:    QRS Duration: 88  QT Interval:  360 QTC Calculation: 386 R Axis:   45 Text Interpretation:  Sinus rhythm Multiple premature complexes, vent & supraven Sinus pause Abnormal R-wave progression, early transition Baseline wander in lead(s) II III aVF Confirmed by Raeford Razor [phone]) on 04/06/2018 12:59:44 PM   Radiology No results found.  Procedures Procedures (including critical care time)  Medications Ordered in ED Medications - No data to display   Initial Impression / Assessment and Plan / ED Course  I have reviewed the triage vital signs and the nursing notes.  Pertinent labs & imaging results that were available during my care of the patient were reviewed by me and considered in my medical decision making (see chart for details).     42 year old female with symptoms consistent with labyrinthitis.  Her neuro exam is nonfocal.  She is Artie been evaluated by ENT and is on steroids.  Can prescribe her PRN meclizine to help with some of her symptoms.  I agree with the steroids.  Advised to follow back up with ENT as previously recommended.  Final Clinical Impressions(s) / ED Diagnoses   Final diagnoses:  Labyrinthitis of right ear    ED Discharge Orders         Ordered    meclizine (ANTIVERT) 25 MG tablet  3 times daily PRN     04/06/18 1403    traMADol (ULTRAM) 50 MG tablet  Every 8 hours PRN     04/06/18 1403           Raeford Razor, MD 04/09/18 1709

## 2018-04-18 ENCOUNTER — Other Ambulatory Visit: Payer: Self-pay | Admitting: Otolaryngology

## 2018-04-18 DIAGNOSIS — H9121 Sudden idiopathic hearing loss, right ear: Secondary | ICD-10-CM

## 2018-04-20 ENCOUNTER — Ambulatory Visit
Admission: RE | Admit: 2018-04-20 | Discharge: 2018-04-20 | Disposition: A | Discharge status: Home / Self Care | Payer: Federal, State, Local not specified - PPO | Source: Ambulatory Visit | Attending: Otolaryngology | Admitting: Otolaryngology

## 2018-04-20 DIAGNOSIS — H9121 Sudden idiopathic hearing loss, right ear: Secondary | ICD-10-CM

## 2018-04-20 MED ORDER — GADOBENATE DIMEGLUMINE 529 MG/ML IV SOLN
19.0000 mL | Freq: Once | INTRAVENOUS | Status: AC | PRN
  Administered 2018-04-20: 19 mL via INTRAVENOUS

## 2018-05-18 ENCOUNTER — Other Ambulatory Visit: Payer: Self-pay

## 2018-05-18 ENCOUNTER — Emergency Department (HOSPITAL_BASED_OUTPATIENT_CLINIC_OR_DEPARTMENT_OTHER)
Admission: EM | Admit: 2018-05-18 | Discharge: 2018-05-18 | Disposition: A | Discharge status: Home / Self Care | Payer: Federal, State, Local not specified - PPO | Attending: Emergency Medicine | Admitting: Emergency Medicine

## 2018-05-18 ENCOUNTER — Encounter (HOSPITAL_BASED_OUTPATIENT_CLINIC_OR_DEPARTMENT_OTHER): Payer: Self-pay

## 2018-05-18 DIAGNOSIS — R0981 Nasal congestion: Secondary | ICD-10-CM | POA: Insufficient documentation

## 2018-05-18 DIAGNOSIS — Z79899 Other long term (current) drug therapy: Secondary | ICD-10-CM | POA: Insufficient documentation

## 2018-05-18 DIAGNOSIS — R630 Anorexia: Secondary | ICD-10-CM | POA: Diagnosis not present

## 2018-05-18 DIAGNOSIS — E876 Hypokalemia: Secondary | ICD-10-CM | POA: Insufficient documentation

## 2018-05-18 LAB — CBC WITH DIFFERENTIAL/PLATELET
Basophils Absolute: 0 10*3/uL (ref 0.0–0.1)
Basophils Relative: 0 %
Eosinophils Absolute: 0 10*3/uL (ref 0.0–0.7)
Eosinophils Relative: 0 %
HEMATOCRIT: 34 % — AB (ref 36.0–46.0)
Hemoglobin: 11.4 g/dL — ABNORMAL LOW (ref 12.0–15.0)
LYMPHS PCT: 17 %
Lymphs Abs: 2 10*3/uL (ref 0.7–4.0)
MCH: 28.2 pg (ref 26.0–34.0)
MCHC: 33.5 g/dL (ref 30.0–36.0)
MCV: 84.2 fL (ref 78.0–100.0)
Monocytes Absolute: 0.9 10*3/uL (ref 0.1–1.0)
Monocytes Relative: 7 %
NEUTROS ABS: 9 10*3/uL — AB (ref 1.7–7.7)
Neutrophils Relative %: 76 %
Platelets: 495 10*3/uL — ABNORMAL HIGH (ref 150–400)
RBC: 4.04 MIL/uL (ref 3.87–5.11)
RDW: 18.9 % — ABNORMAL HIGH (ref 11.5–15.5)
WBC: 11.9 10*3/uL — ABNORMAL HIGH (ref 4.0–10.5)

## 2018-05-18 LAB — COMPREHENSIVE METABOLIC PANEL
ALK PHOS: 99 U/L (ref 38–126)
ALT: 130 U/L — AB (ref 0–44)
AST: 96 U/L — ABNORMAL HIGH (ref 15–41)
Albumin: 3.6 g/dL (ref 3.5–5.0)
Anion gap: 14 (ref 5–15)
BILIRUBIN TOTAL: 0.9 mg/dL (ref 0.3–1.2)
BUN: 8 mg/dL (ref 6–20)
CO2: 22 mmol/L (ref 22–32)
CREATININE: 0.57 mg/dL (ref 0.44–1.00)
Calcium: 8.7 mg/dL — ABNORMAL LOW (ref 8.9–10.3)
Chloride: 102 mmol/L (ref 98–111)
GFR calc Af Amer: >60 mL/min (ref >60)
GFR calc non Af Amer: >60 mL/min (ref >60)
Glucose, Bld: 103 mg/dL — ABNORMAL HIGH (ref 70–99)
Potassium: 2.9 mmol/L — ABNORMAL LOW (ref 3.5–5.1)
SODIUM: 138 mmol/L (ref 135–145)
TOTAL PROTEIN: 7.5 g/dL (ref 6.5–8.1)

## 2018-05-18 LAB — URINALYSIS, ROUTINE W REFLEX MICROSCOPIC
GLUCOSE, UA: NEGATIVE mg/dL
Nitrite: NEGATIVE
PROTEIN: NEGATIVE mg/dL
Specific Gravity, Urine: 1.015 (ref 1.005–1.030)
pH: 7 (ref 5.0–8.0)

## 2018-05-18 LAB — PREGNANCY, URINE: Preg Test, Ur: NEGATIVE

## 2018-05-18 LAB — URINALYSIS, MICROSCOPIC (REFLEX)

## 2018-05-18 MED ORDER — POTASSIUM CHLORIDE CRYS ER 20 MEQ PO TBCR
40.0000 meq | EXTENDED_RELEASE_TABLET | Freq: Once | ORAL | Status: AC
  Administered 2018-05-18: 40 meq via ORAL
  Filled 2018-05-18: qty 2

## 2018-05-18 MED ORDER — SODIUM CHLORIDE 0.9 % IV BOLUS
1000.0000 mL | Freq: Once | INTRAVENOUS | Status: AC
  Administered 2018-05-18: 1000 mL via INTRAVENOUS

## 2018-05-18 MED ORDER — POTASSIUM CHLORIDE 10 MEQ/100ML IV SOLN
10.0000 meq | Freq: Once | INTRAVENOUS | Status: AC
  Administered 2018-05-18: 10 meq via INTRAVENOUS
  Filled 2018-05-18: qty 100

## 2018-05-18 NOTE — ED Triage Notes (Addendum)
Pt c/o "lot of mucus in the back of my throat" x 2 weeks-was seen by PCP rx abx and given steroid injection-c/o decreased I/O-was sent by PCP due to unable to draw blood and start IVF in the office-NAD-steady gait

## 2018-05-18 NOTE — Discharge Instructions (Addendum)
Stay hydrated.   Continue your flonase twice daily.   See ENT for follow up   Your potassium is low. Eat potassium rich foods. Repeat potassium level with your doctor next week   See your doctor in a week for follow up   Return to ER if you have fever, vomiting, dehydration.

## 2018-05-18 NOTE — ED Notes (Signed)
Pt denies chest pain, cough, SOB, fevers, HA, congestion.

## 2018-05-18 NOTE — ED Notes (Signed)
amb to BR 

## 2018-05-18 NOTE — ED Notes (Signed)
Reminded patient that we need a urine specimen

## 2018-05-18 NOTE — ED Provider Notes (Signed)
[facility] EMERGENCY DEPARTMENT Provider Note   CSN: [id number] Arrival date & time: 05/18/18  1610     History   Chief Complaint Chief Complaint  Patient presents with  . Mucus    HPI Michele [name] is a 42 y.o. female presenting with sinus congestion, poor appetite.  Patient has been having sinus congestion and runny nose and mucus in the back of her throat for the last several weeks.  She states that she had poor appetite and has not been eating and drinking much.  She states that she finished a course of doxycycline as well as prednisone.  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  She had an MRI of her brain as well as fitted for hearing aid. She went to the office today and was stuck multiple times but was unable to obtain access.   The history is provided by the patient.    Past Medical History:  Diagnosis Date  . Allergy   . Elevated blood pressure reading without diagnosis of hypertension   . Vitamin D deficiency     Patient Active Problem List   Diagnosis Date Noted  . Sinusitis 10/07/2011  . Weight gain 10/07/2011  . Night sweats 10/07/2011  . Elevated blood pressure 10/07/2011    Past Surgical History:  Procedure Laterality Date  . CESAREAN SECTION  06/09/98 & 04/15/05   x 2     OB History    Gravida  2   Para  2   Term  2   Preterm      AB      Living  2     SAB      TAB      Ectopic      Multiple      Live Births               Home Medications    Prior to Admission medications   Medication Sig Start Date End Date Taking? Authorizing Provider  ALPRAZolam Prudy Feeler) 0.5 MG tablet Take 1 tablet 1 hour before procedure Patient not taking: Reported on 09/10/2014 09/23/12   Henreitta Leber, PA-C  Efinaconazole 10 % SOLN Apply 1 drop topically daily. Patient not taking: Reported on 04/06/2018 10/20/14   Vivi Barrack, DPM  ferrous sulfate 325 (65 FE) MG tablet Take 1 tablet (325  mg total) by mouth daily with breakfast. 10/09/11 10/08/12  Sandford Craze, NP  fluconazole (DIFLUCAN) 150 MG tablet Take 1 tablet (150 mg total) by mouth once. Patient not taking: Reported on 09/10/2014 09/23/12   Henreitta Leber, PA-C  JUNEL FE 1.5/30 1.5-30 MG-MCG tablet Take 1 tablet by mouth daily. 02/19/18   [provider]  LORazepam (ATIVAN) 1 MG tablet Take 0.5 mg by mouth daily as needed. 03/30/18   [provider]  meclizine (ANTIVERT) 25 MG tablet Take 1 tablet (25 mg total) by mouth 3 (three) times daily as needed for dizziness. 04/06/18   Raeford Razor, MD  metroNIDAZOLE (FLAGYL) 500 MG tablet 1 po pc bix x 7 days then 1 tablet weekly x 12 weeks Patient not taking: Reported on 09/10/2014 09/23/12   Henreitta Leber, PA-C  Naftifine HCl (NAFTIN) 2 % GEL Apply 1 application topically daily. Patient not taking: Reported on 04/06/2018 09/10/14   Vivi Barrack, DPM  predniSONE (DELTASONE) 20 MG tablet Take 20 mg by mouth 3 (three) times daily. For 2 weeks 04/02/18   [provider]  Tavaborole (KERYDIN) 5 % SOLN Apply 1 drop topically 1 day or 1 dose. Apply 1 drop to the toenail daily. Patient not taking: Reported on 04/06/2018 09/10/14   Vivi BarrackWagoner, Matthew R, DPM  traMADol (ULTRAM) 50 MG tablet Take 1 tablet (50 mg total) by mouth every 8 (eight) hours as needed. 04/06/18   Raeford RazorKohut, Stephen, MD  Vitamin D, Ergocalciferol, (DRISDOL) 50000 UNITS CAPS TAKE ONE CAPSULE TWICE A WEEK FOR EIGHT WEEKS. Patient not taking: Reported on 04/06/2018 09/24/12   Henreitta LeberPowell, [place], PA-C    Family History Family History  Problem Relation Age of Onset  . Hypertension Mother   . Hypertension Father   . Hypertension Maternal Grandmother   . Diabetes Maternal Grandmother   . Hypertension Paternal Grandmother   . Hypertension Paternal Grandfather   . Hypertension Sister     Social History Social History   Tobacco Use  . Smoking status: Never Smoker  . Smokeless tobacco: Never Used  Substance  Use Topics  . Alcohol use: Not Currently  . Drug use: No     Allergies   Naproxen and Penicillins   Review of Systems Review of Systems  HENT: Positive for congestion and postnasal drip.   All other systems reviewed and are negative.    Physical Exam Updated Vital Signs BP (!) 170/79 (BP Location: Left Arm)   Pulse 84   Temp 99.1 F (37.3 C) (Oral)   Resp 14   Ht 5\' 1"  (1.549 m)   Wt 87.2 kg   LMP 05/12/2018   SpO2 100%   BMI 36.31 kg/m   Physical Exam  Constitutional: She is oriented to person, place, and time. She appears well-developed.  HENT:  Head: Normocephalic.  Right Ear: External ear normal.  Left Ear: External ear normal.  OP clear. No obvious sinus tenderness   Eyes: Pupils are equal, round, and reactive to light. Conjunctivae and EOM are normal.  Neck: Normal range of motion. Neck supple.  Cardiovascular: Normal rate, regular rhythm and normal heart sounds.  Pulmonary/Chest: Effort normal and breath sounds normal. No stridor. No respiratory distress. She has no wheezes.  Abdominal: Soft. Bowel sounds are normal. She exhibits no distension. There is no tenderness.  Musculoskeletal: Normal range of motion.  Neurological: She is alert and oriented to person, place, and time. No cranial nerve deficit. Coordination normal.  Skin: Skin is warm.  Psychiatric: She has a normal mood and affect.  Nursing note and vitals reviewed.    ED Treatments / Results  Labs (all labs ordered are listed, but only abnormal results are displayed) Labs Reviewed  CBC WITH DIFFERENTIAL/PLATELET - Abnormal; Notable for the following components:      Result Value   WBC 11.9 (*)    Hemoglobin 11.4 (*)    HCT 34.0 (*)    RDW 18.9 (*)    Platelets 495 (*)    Neutro Abs 9.0 (*)    All other components within normal limits  COMPREHENSIVE METABOLIC PANEL - Abnormal; Notable for the following components:   Potassium 2.9 (*)    Glucose, Bld 103 (*)    Calcium 8.7 (*)    AST  96 (*)    ALT 130 (*)    All other components within normal limits  URINALYSIS, ROUTINE W REFLEX MICROSCOPIC - Abnormal; Notable for the following components:   APPearance HAZY (*)    Hgb urine dipstick MODERATE (*)    Bilirubin Urine SMALL (*)    Ketones, ur >80 (*)  Leukocytes, UA TRACE (*)    All other components within normal limits  URINALYSIS, MICROSCOPIC (REFLEX) - Abnormal; Notable for the following components:   Bacteria, UA MANY (*)    All other components within normal limits  PREGNANCY, URINE    EKG None  Radiology No results found.  Procedures Procedures (including critical care time)  Medications Ordered in ED Medications  potassium chloride 10 mEq in 100 mL IVPB (10 mEq Intravenous New Bag/Given 05/18/18 1911)  sodium chloride 0.9 % bolus 1,000 mL (1,000 mLs Intravenous New Bag/Given 05/18/18 1738)  potassium chloride SA (K-DUR,KLOR-CON) CR tablet 40 mEq (40 mEq Oral Given 05/18/18 1911)     Initial Impression / Assessment and Plan / ED Course  I have reviewed the triage vital signs and the nursing notes.  Pertinent labs & imaging results that were available during my care of the patient were reviewed by me and considered in my medical decision making (see chart for details).     NAYAH LUKENS is a 42 y.o. female here with mucous, congestion. Likely viral in etiology. Afebrile, well appearing. No sinus tenderness. She finished doxycycline and prednisone already. She saw ENT for hearing loss. Appears mildly dehydrated so will check labs give IVF. Likely need ENT eval for persistent congestion.   7:39 PM Labs showed K 2.9, supplemented. UA showed ketones. Given IVF and potassium and felt better. She is on flonase already and I encouraged her to follow up with ENT doctor and repeat BMP with PCP in a week. Encouraged her to eat more foods with potassium in it.    Final Clinical Impressions(s) / ED Diagnoses   Final diagnoses:  None    ED Discharge  Orders    None       Charlynne Pander, MD 05/18/18 1940

## 2018-05-30 ENCOUNTER — Encounter (HOSPITAL_BASED_OUTPATIENT_CLINIC_OR_DEPARTMENT_OTHER): Payer: Self-pay | Admitting: Emergency Medicine

## 2018-05-30 ENCOUNTER — Other Ambulatory Visit: Payer: Self-pay

## 2018-05-30 ENCOUNTER — Emergency Department (HOSPITAL_BASED_OUTPATIENT_CLINIC_OR_DEPARTMENT_OTHER): Payer: Federal, State, Local not specified - PPO

## 2018-05-30 ENCOUNTER — Emergency Department (HOSPITAL_BASED_OUTPATIENT_CLINIC_OR_DEPARTMENT_OTHER)
Admission: EM | Admit: 2018-05-30 | Discharge: 2018-05-30 | Disposition: A | Discharge status: Home / Self Care | Payer: Federal, State, Local not specified - PPO | Attending: Emergency Medicine | Admitting: Emergency Medicine

## 2018-05-30 DIAGNOSIS — R0981 Nasal congestion: Secondary | ICD-10-CM | POA: Diagnosis present

## 2018-05-30 DIAGNOSIS — E876 Hypokalemia: Secondary | ICD-10-CM | POA: Diagnosis not present

## 2018-05-30 DIAGNOSIS — Z79899 Other long term (current) drug therapy: Secondary | ICD-10-CM | POA: Diagnosis not present

## 2018-05-30 DIAGNOSIS — R5383 Other fatigue: Secondary | ICD-10-CM | POA: Insufficient documentation

## 2018-05-30 DIAGNOSIS — R110 Nausea: Secondary | ICD-10-CM | POA: Diagnosis not present

## 2018-05-30 LAB — BASIC METABOLIC PANEL
Anion gap: 14 (ref 5–15)
BUN: 9 mg/dL (ref 6–20)
CALCIUM: 8.9 mg/dL (ref 8.9–10.3)
CO2: 24 mmol/L (ref 22–32)
CREATININE: 0.58 mg/dL (ref 0.44–1.00)
Chloride: 100 mmol/L (ref 98–111)
GFR calc Af Amer: >60 mL/min (ref >60)
GLUCOSE: 97 mg/dL (ref 70–99)
Potassium: 3 mmol/L — ABNORMAL LOW (ref 3.5–5.1)
Sodium: 138 mmol/L (ref 135–145)

## 2018-05-30 LAB — CBC WITH DIFFERENTIAL/PLATELET
BASOS ABS: 0 10*3/uL (ref 0.0–0.1)
BASOS PCT: 0 %
EOS ABS: 0.1 10*3/uL (ref 0.0–0.7)
EOS PCT: 1 %
HCT: 32.8 % — ABNORMAL LOW (ref 36.0–46.0)
Hemoglobin: 11.3 g/dL — ABNORMAL LOW (ref 12.0–15.0)
LYMPHS PCT: 20 %
Lymphs Abs: 2 10*3/uL (ref 0.7–4.0)
MCH: 28.5 pg (ref 26.0–34.0)
MCHC: 34.5 g/dL (ref 30.0–36.0)
MCV: 82.6 fL (ref 78.0–100.0)
MONO ABS: 1 10*3/uL (ref 0.1–1.0)
Monocytes Relative: 10 %
Neutro Abs: 7 10*3/uL (ref 1.7–7.7)
Neutrophils Relative %: 69 %
PLATELETS: 506 10*3/uL — AB (ref 150–400)
RBC: 3.97 MIL/uL (ref 3.87–5.11)
RDW: 19.2 % — AB (ref 11.5–15.5)
WBC: 10.1 10*3/uL (ref 4.0–10.5)

## 2018-05-30 LAB — T4, FREE: Free T4: 1.09 ng/dL (ref 0.82–1.77)

## 2018-05-30 LAB — TSH: TSH: 1.118 u[IU]/mL (ref 0.350–4.500)

## 2018-05-30 MED ORDER — ONDANSETRON 4 MG PO TBDP
4.0000 mg | ORAL_TABLET | Freq: Once | ORAL | Status: AC
  Administered 2018-05-30: 4 mg via ORAL
  Filled 2018-05-30: qty 1

## 2018-05-30 MED ORDER — POTASSIUM CHLORIDE ER 10 MEQ PO TBCR: 30.0000 meq | EXTENDED_RELEASE_TABLET | Freq: Every day | ORAL | 0 refills | Status: DC

## 2018-05-30 MED ORDER — POTASSIUM CHLORIDE CRYS ER 20 MEQ PO TBCR
40.0000 meq | EXTENDED_RELEASE_TABLET | Freq: Once | ORAL | Status: AC
  Administered 2018-05-30: 40 meq via ORAL
  Filled 2018-05-30: qty 2

## 2018-05-30 MED ORDER — FLUTICASONE PROPIONATE 50 MCG/ACT NA SUSP
1.0000 | Freq: Every day | NASAL | Status: DC
  Filled 2018-05-30: qty 16

## 2018-05-30 MED ORDER — ONDANSETRON 4 MG PO TBDP: 4.0000 mg | ORAL_TABLET | Freq: Three times a day (TID) | ORAL | 0 refills | Status: DC | PRN

## 2018-05-30 MED ORDER — SODIUM CHLORIDE 0.9 % IV BOLUS
500.0000 mL | Freq: Once | INTRAVENOUS | Status: AC
  Administered 2018-05-30: 500 mL via INTRAVENOUS

## 2018-05-30 NOTE — Discharge Instructions (Addendum)
Your thyroid labs today were normal. Take Zofran as needed for nausea. Return to ED for worsening symptoms, severe chest pain or abdominal pain, vomiting or coughing up blood, lightheadedness or loss of consciousness.

## 2018-05-30 NOTE — ED Triage Notes (Signed)
Pt reports "a lot of mucus in my throat" x 2-3 weeks. Was seen here for same last week. Also is concerned about elevated LFT's and states she had them redrawn and they were higher. Had liver US done this week but the results are not back yet. Also reports decreased appetite.
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
## 2018-05-30 NOTE — ED Notes (Signed)
ED Provider at bedside. 

## 2018-05-30 NOTE — ED Provider Notes (Signed)
[facility] EMERGENCY DEPARTMENT Provider Note   CSN: [id number] Arrival date & time: 05/30/18  1154     History   Chief Complaint Chief Complaint  Patient presents with  . Congestion  . Fatigue    HPI Michele [name] is a 42 y.o. female who presents to ED for evaluation of 3-week history of hacking up thick saliva.  She also endorses 33-month history of decreased appetite, nausea, decreased energy.  She also reports hearing loss in her right ear.  These symptoms all began after she returned from a cruise to the [place] on July 2019.  She has since seen 2 ENT doctors regarding her hearing loss and has had a hearing aid and transmitter put in bilateral ears.  She is also seen her PCP several times for these complaints.  She was found to have elevated LFTs and had a right upper quadrant ultrasound done yesterday but has not gotten the results.  She has been referred to a GI specialist whom she is meeting with in 3 days. Patient's main complaint today is the hacking of saliva.  No improvement with antihistamines as prescribed by her PCP.  She has mentioned to her second ENT doctor regarding the mucus who deferred to her PCP.  She has not been checked for thyroid abnormalities since the symptoms began or in the past 5 years.  She denies any specific abdominal pain, fevers, Tylenol use, alcohol use, chest pain, shortness of breath, fever.  HPI  Past Medical History:  Diagnosis Date  . Allergy   . Elevated blood pressure reading without diagnosis of hypertension   . Vitamin D deficiency     Patient Active Problem List   Diagnosis Date Noted  . Sinusitis 10/07/2011  . Weight gain 10/07/2011  . Night sweats 10/07/2011  . Elevated blood pressure 10/07/2011    Past Surgical History:  Procedure Laterality Date  . CESAREAN SECTION  06/09/98 & 04/15/05   x 2     OB History    Gravida  2   Para  2   Term  2   Preterm      AB      Living  2     SAB      TAB     Ectopic      Multiple      Live Births               Home Medications    Prior to Admission medications   Medication Sig Start Date End Date Taking? Authorizing Provider  ferrous sulfate 325 (65 FE) MG tablet Take 1 tablet (325 mg total) by mouth daily with breakfast. 10/09/11 05/30/18 Yes Sandford Craze, NP  JUNEL FE 1.5/30 1.5-30 MG-MCG tablet Take 1 tablet by mouth daily. 02/19/18  Yes [provider]  ALPRAZolam Prudy Feeler) 0.5 MG tablet Take 1 tablet 1 hour before procedure Patient not taking: Reported on 09/10/2014 09/23/12   Henreitta Leber, PA-C  Efinaconazole 10 % SOLN Apply 1 drop topically daily. Patient not taking: Reported on 04/06/2018 10/20/14   Vivi Barrack, DPM  fluconazole (DIFLUCAN) 150 MG tablet Take 1 tablet (150 mg total) by mouth once. Patient not taking: Reported on 09/10/2014 09/23/12   Henreitta Leber, PA-C  LORazepam (ATIVAN) 1 MG tablet Take 0.5 mg by mouth daily as needed. 03/30/18   [provider]  meclizine (ANTIVERT) 25 MG tablet Take 1 tablet (25 mg total) by mouth 3 (three) times daily as needed for dizziness.  04/06/18   Raeford Razor, MD  metroNIDAZOLE (FLAGYL) 500 MG tablet 1 po pc bix x 7 days then 1 tablet weekly x 12 weeks Patient not taking: Reported on 09/10/2014 09/23/12   Henreitta Leber, PA-C  Naftifine HCl (NAFTIN) 2 % GEL Apply 1 application topically daily. Patient not taking: Reported on 04/06/2018 09/10/14   Vivi Barrack, DPM  ondansetron (ZOFRAN ODT) 4 MG disintegrating tablet Take 1 tablet (4 mg total) by mouth every 8 (eight) hours as needed for nausea or vomiting. 05/30/18   Jacqulyne Gladue, PA-C  potassium chloride (K-DUR) 10 MEQ tablet Take 3 tablets (30 mEq total) by mouth daily for 5 days. 05/30/18 06/04/18  Zayleigh Stroh, PA-C  predniSONE (DELTASONE) 20 MG tablet Take 20 mg by mouth 3 (three) times daily. For 2 weeks 04/02/18   [provider]  Tavaborole (KERYDIN) 5 % SOLN Apply 1 drop topically 1 day or 1 dose.  Apply 1 drop to the toenail daily. Patient not taking: Reported on 04/06/2018 09/10/14   Vivi Barrack, DPM  traMADol (ULTRAM) 50 MG tablet Take 1 tablet (50 mg total) by mouth every 8 (eight) hours as needed. 04/06/18   Raeford Razor, MD  Vitamin D, Ergocalciferol, (DRISDOL) 50000 UNITS CAPS TAKE ONE CAPSULE TWICE A WEEK FOR EIGHT WEEKS. Patient not taking: Reported on 04/06/2018 09/24/12   Henreitta Leber, PA-C    Family History Family History  Problem Relation Age of Onset  . Hypertension Mother   . Hypertension Father   . Hypertension Maternal Grandmother   . Diabetes Maternal Grandmother   . Hypertension Paternal Grandmother   . Hypertension Paternal Grandfather   . Hypertension Sister     Social History Social History   Tobacco Use  . Smoking status: Never Smoker  . Smokeless tobacco: Never Used  Substance Use Topics  . Alcohol use: Not Currently  . Drug use: No     Allergies   Naproxen and Penicillins   Review of Systems Review of Systems  Constitutional: Positive for activity change, appetite change and fatigue. Negative for chills and fever.  HENT: Positive for postnasal drip. Negative for ear pain, rhinorrhea, sneezing and sore throat.   Eyes: Negative for photophobia and visual disturbance.  Respiratory: Positive for cough. Negative for chest tightness, shortness of breath and wheezing.   Cardiovascular: Negative for chest pain and palpitations.  Gastrointestinal: Negative for abdominal pain, blood in stool, constipation, diarrhea, nausea and vomiting.  Genitourinary: Negative for dysuria, hematuria and urgency.  Musculoskeletal: Negative for myalgias.  Skin: Negative for rash.  Neurological: Negative for dizziness, weakness and light-headedness.     Physical Exam Updated Vital Signs BP (!) 162/105 (BP Location: Left Arm)   Pulse 79   Temp 98.4 F (36.9 C) (Oral)   Resp 18   Ht 5\' 1"  (1.549 m)   Wt 85.2 kg   LMP 05/12/2018   SpO2 100%   BMI 35.49  kg/m   Physical Exam  Constitutional: She appears well-developed and well-nourished. No distress.  HENT:  Head: Normocephalic and atraumatic.  Nose: Nose normal.  Thick postnasal drainage seen.  Eyes: Conjunctivae and EOM are normal. Left eye exhibits no discharge. No scleral icterus.  Neck: Normal range of motion. Neck supple.  Cardiovascular: Normal rate, regular rhythm, normal heart sounds and intact distal pulses. Exam reveals no gallop and no friction rub.  No murmur heard. Pulmonary/Chest: Effort normal and breath sounds normal. No respiratory distress.  Abdominal: Soft. Bowel sounds are normal. She exhibits no  distension. There is no tenderness. There is no guarding.  Musculoskeletal: Normal range of motion. She exhibits no edema.  Neurological: She is alert. She exhibits normal muscle tone. Coordination normal.  Skin: Skin is warm and dry. No rash noted.  Psychiatric: She has a normal mood and affect.  Nursing note and vitals reviewed.    ED Treatments / Results  Labs (all labs ordered are listed, but only abnormal results are displayed) Labs Reviewed  BASIC METABOLIC PANEL - Abnormal; Notable for the following components:      Result Value   Potassium 3.0 (*)    All other components within normal limits  CBC WITH DIFFERENTIAL/PLATELET - Abnormal; Notable for the following components:   Hemoglobin 11.3 (*)    HCT 32.8 (*)    RDW 19.2 (*)    Platelets 506 (*)    All other components within normal limits  TSH  T4, FREE    EKG None  Radiology Dg Chest 2 View  Result Date: 05/30/2018 CLINICAL DATA:  Cough and congestion EXAM: CHEST - 2 VIEW COMPARISON:  None. FINDINGS: Lungs are clear. Heart size and pulmonary vascularity are normal. No adenopathy. No evident bone lesions. IMPRESSION: No edema or consolidation. Electronically Signed   By: Bretta Bang III M.D.   On: 05/30/2018 13:57    Procedures Procedures (including critical care time)  Medications  Ordered in ED Medications  fluticasone (FLONASE) 50 MCG/ACT nasal spray 1 spray (1 spray Each Nare Not Given 05/30/18 1527)  sodium chloride 0.9 % bolus 500 mL (500 mLs Intravenous New Bag/Given 05/30/18 1525)  ondansetron (ZOFRAN-ODT) disintegrating tablet 4 mg (4 mg Oral Given 05/30/18 1427)  potassium chloride SA (K-DUR,KLOR-CON) CR tablet 40 mEq (40 mEq Oral Given 05/30/18 1528)     Initial Impression / Assessment and Plan / ED Course  I have reviewed the triage vital signs and the nursing notes.  Pertinent labs & imaging results that were available during my care of the patient were reviewed by me and considered in my medical decision making (see chart for details).     42 year old female presents to ED for multiple complaints.  Ever since she went on a cruise to the [place] in July, she is suffering from hearing loss, fatigue, loss of appetite, loss of energy.  For the past 3 weeks, she has been spitting up thick mucus/saliva.  She has seen 2 ENT doctors, her primary care provider and is scheduled to meet with a GI specialist regarding elevated LFTs.  She had not right upper quadrant ultrasound done at her PCPs office yesterday.  On exam today there is postnasal drainage seen.  She is afebrile.  Lungs are clear to auscultation bilaterally.  I had a discussion with the patient regarding general work-up for all of her symptoms versus just targeting her symptoms that are concerning today.  Do not feel that repeat emergent ultrasound is indicated at this time.  She is being worked up by her PCP and GI specialist for this.  CBC, BMP revealed mild hypokalemia at 3.  This is repleted orally.  Chest x-ray is unremarkable.  I gave patient Zofran ODT with significant and near resolution of her symptoms.  TSH and free T4 are normal.  Encouraged patient to follow-up with her PCP and to keep her GI appointment for this week.  At this time I do not suspect that there is an emergent cause of her symptoms that  requires further work-up.  Advised to return to ED for  any severe worsening symptoms.  Portions of this note were generated with Scientist, clinical (histocompatibility and immunogenetics). Dictation errors may occur despite best attempts at proofreading.   Final Clinical Impressions(s) / ED Diagnoses   Final diagnoses:  Hypokalemia  Nausea    ED Discharge Orders         Ordered    ondansetron (ZOFRAN ODT) 4 MG disintegrating tablet  Every 8 hours PRN     05/30/18 1533    potassium chloride (K-DUR) 10 MEQ tablet  Daily     05/30/18 1533           Dietrich Pates, PA-C 05/30/18 1534    Tilden Fossa, MD 06/01/18 1454

## 2018-06-02 ENCOUNTER — Encounter: Payer: Self-pay | Admitting: Gastroenterology

## 2018-06-02 ENCOUNTER — Ambulatory Visit: Payer: Federal, State, Local not specified - PPO | Admitting: Gastroenterology

## 2018-06-02 VITALS — BP 160/100 | HR 100 | Ht 61.0 in | Wt 187.0 lb

## 2018-06-02 DIAGNOSIS — Z8619 Personal history of other infectious and parasitic diseases: Secondary | ICD-10-CM

## 2018-06-02 DIAGNOSIS — R945 Abnormal results of liver function studies: Secondary | ICD-10-CM

## 2018-06-02 DIAGNOSIS — R7989 Other specified abnormal findings of blood chemistry: Secondary | ICD-10-CM

## 2018-06-02 DIAGNOSIS — R1310 Dysphagia, unspecified: Secondary | ICD-10-CM

## 2018-06-02 DIAGNOSIS — R630 Anorexia: Secondary | ICD-10-CM

## 2018-06-02 DIAGNOSIS — R112 Nausea with vomiting, unspecified: Secondary | ICD-10-CM

## 2018-06-02 NOTE — Progress Notes (Addendum)
[place] VISIT   Primary Care Provider Glendon Axe, [address] [place] [address] [phone]  Referring Provider Glendon Axe, MD [address] [place],  [address] [phone]  Patient Profile: Michele [name] is a 42 y.o. female with a pmh significant for recent Sensorineural Hearing Loss, Allergies, anxiety, obesity.  The patient presents to the [facility] for an evaluation and management of problem(s) noted below:  Problem List 1. Abnormal liver function test   2. Non-intractable vomiting with nausea, unspecified vomiting type   3. Decreased appetite   4. History of thrush   5. Dysphagia, unspecified type   6. Odynophagia     History of Present Illness: This is the patient's first visit to the [facility] clinic.  The patient has been referred for further evaluation as a result of recent an acute onset GI symptoms.  Over the course of the last few months after a trip with her family on a boat to the [place] including [place] in [place] she returned and developed acute onset sensorineural hearing loss.  She has been worked up at [facility] as well as [facility] for evaluation of this including a recent brain MRI that showed no evidence of acute process such as stroke or mass lesion.  She now has hearing aids in place.  No other family members who went with her not had any similar symptoms develop.  For the last 3 months the patient describes increasing issues of sinus congestion as well as a thick mucus production in the back of her throat.  She is been seen by her primary care on multiple occasions in effort of trying to alleviate the symptoms.  She has been put on allergy medicines as well as been given antibiotics.  Interestingly she was given doxycycline and had a significant worsening of her symptoms after her doxycycline had been initiated but ended up going ahead and trying to complete that.  Over the course  the last 3 to 4 weeks she has had the onset of significant nausea as well as vomiting.  She has had vertiginous symptoms at times with the hearing loss however the symptoms have continued to improve slowly and are not too significant at this point in time.  She is not vomiting up anything that she has eaten recently.  She describes weight loss as a result of her inability to maintain oral intake.  She is been seen in the emergency department on multiple occasions in the last few weeks.  She is vomiting at least 2-4 times per day.  She had been initiated on steroids during the work-up of her sensorineural hearing loss and did develop thrush that required some sort of oral medication (presumptively clotrimazole atrocious that she describes sucking on multiple times per day).  She has not had evidence of thrush in her mouth for many weeks but has not looked at her tongue significantly.  Her symptoms of odynophagia dysphagia occurred weeks after her thrush has been treated.  When she went to her primary care physician was found to have abnormal liver biochemical tests as noted in the results reviewed below with significant elevation in her transferases.  These have down trended based on labs in the epic system from recent ED visits.  She does not take significant nonsteroidals or BC/Goody powders.  She is never had an upper or lower endoscopy.  The patient denies any issues with jaundice, scleral icterus, pruritus, darkened/amber urine, clay-colored stools,  LE edema, hematemesis, coffee-ground emesis, abdominal distention, confusion, new generalized pruritus.    In regards to RFs for liver disease: High-Risk Sexual Practices -denies History of Alcohol Use -social alcohol consumption (1-2 beverages per week on the weekends) History of IVDU -denies History of Tattoos -denies History of Blood Donation/Transfusion -denies Personal Hx/Components of Metabolic Syndrome -denies FHx of Liver Disease -father had  underlying liver disease presumed to be fatty liver  GI Review of Systems Positive as above including decreased appetite, bloating at times Negative for globus, change in bowel habits (uses restroom once or twice daily), melena, hematochezia  Review of Systems General: Denies fevers/chills/night sweats HEENT: Denies oral lesions Cardiovascular: Denies chest pain Pulmonary: Denies shortness of breath Gastroenterological: See HPI Hematological: Denies easy bruising/bleeding Dermatological: Denies skin changes Psychological: Currently has anxiety Musculoskeletal: Denies new arthralgias   Medications Current Outpatient Medications  Medication Sig Dispense Refill  . JUNEL FE 1.5/30 1.5-30 MG-MCG tablet Take 1 tablet by mouth daily.  11  . LORazepam (ATIVAN) 1 MG tablet Take 0.5 mg by mouth daily as needed.  0  . ondansetron (ZOFRAN ODT) 4 MG disintegrating tablet Take 1 tablet (4 mg total) by mouth every 8 (eight) hours as needed for nausea or vomiting. 6 tablet 0  . potassium chloride (K-DUR) 10 MEQ tablet Take 3 tablets (30 mEq total) by mouth daily for 5 days. 15 tablet 0  . Efinaconazole 10 % SOLN Apply 1 drop topically daily. (Patient not taking: Reported on 04/06/2018) 8 mL 11  . ferrous sulfate 325 (65 FE) MG tablet Take 1 tablet (325 mg total) by mouth daily with breakfast. 30 tablet 11  . Naftifine HCl (NAFTIN) 2 % GEL Apply 1 application topically daily. (Patient not taking: Reported on 04/06/2018) 1 Tube 2  . omeprazole (PRILOSEC) 40 MG capsule Take 1 capsule by mouth daily.  0  . ondansetron (ZOFRAN) 4 MG tablet Take 1 tablet (4 mg total) by mouth every 8 (eight) hours as needed for nausea, vomiting or refractory nausea / vomiting. 30 tablet 0  . Tavaborole (KERYDIN) 5 % SOLN Apply 1 drop topically 1 day or 1 dose. Apply 1 drop to the toenail daily. (Patient not taking: Reported on 04/06/2018) 10 mL 2   No current facility-administered medications for this visit.      Allergies Allergies  Allergen Reactions  . Naproxen Hives  . Penicillins Hives, Swelling and Other (See Comments)    Childhood Allergy Has patient had a PCN reaction causing immediate rash, facial/tongue/throat swelling, SOB or lightheadedness with hypotension: Yes Has patient had a PCN reaction causing severe rash involving mucus membranes or skin necrosis: No Has patient had a PCN reaction that required hospitalization: No Has patient had a PCN reaction occurring within the last 10 years: Unknown If all of the above answers are "NO", then may proceed with Cephalosporin use.     Histories Past Medical History:  Diagnosis Date  . Allergy   . Elevated blood pressure reading without diagnosis of hypertension   . Hearing loss 03/2018  . Vitamin D deficiency    Past Surgical History:  Procedure Laterality Date  . CESAREAN SECTION  06/09/98 & 04/15/05   x 2   Social History   Socioeconomic History  . Marital status: Married    Spouse name: Not on file  . Number of children: 2  . Years of education: Not on file  . Highest education level: Not on file  Occupational History    Employer: [employer]  Social Needs  . Financial resource strain: Not on file  . Food insecurity:    Worry: Not on file    Inability: Not on file  . Transportation needs:    Medical: Not on file    Non-medical: Not on file  Tobacco Use  . Smoking status: Never Smoker  . Smokeless tobacco: Never Used  Substance and Sexual Activity  . Alcohol use: Not Currently  . Drug use: No  . Sexual activity: Yes    Birth control/protection: Pill  Lifestyle  . Physical activity:    Days per week: Not on file    Minutes per session: Not on file  . Stress: Not on file  Relationships  . Social connections:    Talks on phone: Not on file    Gets together: Not on file    Attends religious service: Not on file    Active member of club or organization: Not on file    Attends meetings of clubs or  organizations: Not on file    Relationship status: Not on file  . Intimate partner violence:    Fear of current or ex partner: Not on file    Emotionally abused: Not on file    Physically abused: Not on file    Forced sexual activity: Not on file  Other Topics Concern  . Not on file  Social History Narrative   Caffeine use: sweet tea once a day   regular exercise:  Just joined gym.   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   Married   2 daughters- born 95 and 2006.         Family History  Problem Relation Age of Onset  . Hypertension Mother   . Hypertension Father   . Hypertension Maternal Grandmother   . Diabetes Maternal Grandmother   . Hypertension Paternal Grandmother   . Hypertension Paternal Grandfather   . Hypertension Sister    I have reviewed her medical, social, and family history in detail and updated the electronic medical record as necessary.    PHYSICAL EXAMINATION  BP (!) 160/100   Pulse 100   Ht _0  (1.549 m)   Wt 187 lb (84.8 kg)   LMP 05/12/2018 (Approximate)   BMI 35.33 kg/m  Wt Readings from Last 3 Encounters:  06/02/18 187 lb (84.8 kg)  05/30/18 187 lb 13.3 oz (85.2 kg)  05/18/18 192 lb 3 oz (87.2 kg)  GEN: NAD, appears stated age, doesn't appear chronically ill, accompanied by mother and sister PSYCH: Cooperative, without pressured speech EYE: Conjunctivae pink, sclerae anicteric ENT: MMM, tongue has a whitish hue in the back of the oropharynx NECK: Supple CV: RR without R/Gs  RESP: CTAB posteriorly, without wheezing GI: NABS, soft, mild tenderness to palpation in the midepigastrium and right upper quadrant, rounded, without rebound or guarding, no HSM appreciated MSK/EXT: No lower extremity edema, no palmar erythema, no Dupuytren's contractures SKIN: No jaundice, no spider angiomata on upper thorax NEURO:  Alert & Oriented x 3, no focal deficits, no evidence of asterixis   REVIEW OF DATA  I  reviewed the following data at the time of this encounter:  GI Procedures and Studies  No relevant studies to review  Laboratory Studies  Outside laboratories reviewed September 2019 screening urine drug test negative for THC, benzos, opioids, TCAs September 2019 urine pregnancy negative September 2019 urine culture mixed flora  May 26, 2018 Hepatitis A IgM negative Hepatitis B core IgM negative Hepatitis  C antibody negative Hepatitis B surface antigen negative Triglycerides 91 Cholesterol 260 Ferritin 49 (within normal limits) TSH 0.827 (within normal limits) Hemoglobin over hematocrit 11.3/34 MCV 85 Platelets 453 (above upper limit of normal) A1c 5.6 Potassium 3.3 Anion gap 12 CO2 25 Albumin 4.1 Creatinine 0.9 Bilirubin total 1.3 AST/ALT 237/383 Alk phos 120 Magnesium 1.9  April 02, 2018 AST/ALT 37/53 T bili 0.4 Alk phos 117 ANA negative Rheumatoid factor XI (within normal limits)  Dec 31, 2017 Platelets 627,000 Hemoglobin 11.1 Hematocrit 35.6 MCV 86 HIV negative AST/ALT 24/33 T bili 0.6 Alk phos 170   Imaging Studies  September 2019 ultrasound abdomen (only report from outside center) Gallbladder is normal.  CBD not visualized.  No intrahepatic biliary ductal dilation.  Liver size and echotexture is normal.  Hepatopetal flow is normal in the portal vein.  Aorta normal in caliber.  Both kidneys have normal appearance.  Pancreas is echogenic but otherwise normal with concern for possible fatty infiltration.   ASSESSMENT  Michele [name] is a 42 y.o. female  with a pmh significant for recent Sensorineural Hearing Loss, Allergies, anxiety, obesity.  The patient is seen today for evaluation and management of:  1. Abnormal liver function test   2. Non-intractable vomiting with nausea, unspecified vomiting type   3. Decreased appetite   4. History of thrush   5. Dysphagia, unspecified type   6. Odynophagia    This patient's clinical history is concerning  in the setting of her daily symptoms as well as the elevation in her liver biochemical profile.  I suspect that she has developed issues in her esophagus as a result of her recent steroid use may query the possibility of esophageal candidiasis causing some of her symptoms.  She could have underlying peptic ulcer disease causing her to have nausea as well in the setting of recent steroid use although no significant nonsteroidals have been used.  She could in theory have symptoms as a result of the sensorineural hearing loss well but oftentimes that would be ongoing with symptoms as result of vertigo and she does not really describe significant amounts of that at this point in time.  We will try to rule out metabolic etiologies for her current symptoms.  She does not seem to have diabetes based on labs that were drawn by her primary care physician.  She can go home and look at the medications that she has taken recently and if she has clotrimazole troches she will alert [place] and we will likely give the okay for her to have that restarted in the interim of being worked up.  I have offered her some antiemetics that she will have that in this acute onset of symptoms while we work her up, however as her potassium was 3.0 at her recent ED visit and she barely started her potassium yesterday I am not sure we need to repeat her electrolytes today but will before the end of the week and have supplementation if necessary.  I described and discussed the use of antiemetics and the risk if patients have electrolytes abnormalities to her and her family.  We will proceed with an upper endoscopy in the coming 1 to 2 weeks trying to further elucidate her symptoms.  She started PPI a few weeks ago but is not clear that is made a significant difference in her symptoms.  Based on her evaluation we will make further work-up including gastric emptying study if it was felt to be necessary though I suspect this  may end up being a  postinfectious viral process that may take a few weeks or months to improve from.  In regards to her liver biochemical test profile and the abnormalities associated with this we will continue her work-up as outlined below.  I am not sure that she will end up needing to have liver biopsy at this point time and with her recurrent nausea and vomiting this could be result of that.  Her recent liver ultrasound done at her primary care office suggested a fatty liver but otherwise normal liver.  Depending on her liver biochemical profile we will consider the role of a liver Doppler ultrasound in the setting of her having and being on oral contraceptives she is at increased risk for venous thromboembolism and we would consider the role of the liver Doppler to ensure that she has not developed any evidence of Budd-Chiari portal venous thrombus.  The risks and benefits of endoscopic evaluation were discussed with the patient; these include but are not limited to the risk of perforation, infection, bleeding, missed lesions, lack of diagnosis, severe illness requiring hospitalization, as well as anesthesia and sedation related illnesses.  The patient is agreeable to proceed.  All patient questions were answered, to the best of my ability, and the patient agrees to the aforementioned plan of action with follow-up as indicated.   PLAN  1. Non-intractable vomiting with nausea, unspecified vomiting type - Amylase; Future - Lipase; Future - Hepatic function panel; Future - High sensitivity CRP; Future - Tissue transglutaminase, IgA; Future - IgA; Future - BMP & Magnesium; Future - Zofran Rx given to patient for PRN use  2. Decreased appetite  3. History of thrush - She will check her home medications and see about which medicine she has and if it is a clotrimazole troche then will be reasonable to consider restarting- Ambulatory referral to Gastroenterology  4. Dysphagia, unspecified type - Ambulatory referral to  Gastroenterology  5. Odynophagia  6. Abnormal liver function test - Hepatic function panel; Future - Tissue transglutaminase, IgA; Future - Protime-INR; Future - Hepatitis A antibody, total; Future - Hepatitis B surface antibody,qualitative; Future   Orders Placed This Encounter  Procedures  . Amylase  . Lipase  . Hepatic function panel  . High sensitivity CRP  . Tissue transglutaminase, IgA  . Protime-INR  . Hepatitis A antibody, total  . Hepatitis B surface antibody,qualitative  . Cortisol  . Magnesium  . IgA  . Ambulatory referral to Gastroenterology    New Prescriptions   ONDANSETRON (ZOFRAN) 4 MG TABLET    Take 1 tablet (4 mg total) by mouth every 8 (eight) hours as needed for nausea, vomiting or refractory nausea / vomiting.   Modified Medications   No medications on file    Planned Follow Up: No follow-ups on file.   Justice Britain, MD [facility] Office # [phone]

## 2018-06-02 NOTE — Patient Instructions (Signed)
Your provider has requested that you go to the basement level for lab work before leaving today. Press "B" on the elevator. The lab is located at the first door on the left as you exit the elevator.  You have been scheduled for an endoscopy. Please follow written instructions given to you at your visit today. If you use inhalers (even only as needed), please bring them with you on the day of your procedure. Your physician has requested that you go to www.startemmi.com and enter the access code given to you at your visit today. This web site gives a general overview about your procedure. However, you should still follow specific instructions given to you by our office regarding your preparation for the procedure.  Thank you for entrusting me with your care and choosing [facility].  Dr Meridee Score

## 2018-06-03 ENCOUNTER — Encounter: Payer: Self-pay | Admitting: Gastroenterology

## 2018-06-03 DIAGNOSIS — R1110 Vomiting, unspecified: Secondary | ICD-10-CM | POA: Insufficient documentation

## 2018-06-03 DIAGNOSIS — R945 Abnormal results of liver function studies: Secondary | ICD-10-CM | POA: Insufficient documentation

## 2018-06-03 DIAGNOSIS — Z8619 Personal history of other infectious and parasitic diseases: Secondary | ICD-10-CM

## 2018-06-03 DIAGNOSIS — R1310 Dysphagia, unspecified: Secondary | ICD-10-CM | POA: Insufficient documentation

## 2018-06-03 DIAGNOSIS — R630 Anorexia: Secondary | ICD-10-CM | POA: Insufficient documentation

## 2018-06-03 DIAGNOSIS — R7989 Other specified abnormal findings of blood chemistry: Secondary | ICD-10-CM | POA: Insufficient documentation

## 2018-06-03 HISTORY — DX: Personal history of other infectious and parasitic diseases: Z86.19

## 2018-06-03 MED ORDER — ONDANSETRON HCL 4 MG PO TABS: 4.0000 mg | ORAL_TABLET | Freq: Three times a day (TID) | ORAL | 0 refills | Status: DC | PRN

## 2018-06-04 ENCOUNTER — Telehealth: Payer: Self-pay | Admitting: Gastroenterology

## 2018-06-04 NOTE — Telephone Encounter (Signed)
Thank you Patty, we were waiting to hear back from her. She can restart the Clotrimazole troche, in case this is esophageal candidiasis causing her some of her symptoms. Thanks. Liz Beach

## 2018-06-04 NOTE — Telephone Encounter (Signed)
Patient states Dr.Mansouraty wanted to know the medication she was on for Thrush? And she called to let the nurse know the other day. Patient thought someone was going to get back in touch with her to let her know if she needed to start taking it again or night. Patient calling to check status.

## 2018-06-04 NOTE — Telephone Encounter (Signed)
The pt called to inform Dr Meridee Score what medication she was taking for thrush.  Clotrimazole 10 mg troche  1 by mouth 5 times daily for 10 days. Do you want her to restart?

## 2018-06-04 NOTE — Telephone Encounter (Signed)
The pt has been advised and she will restart the clotrimazole and will let her pharmacy know if she needs any refills.

## 2018-06-05 ENCOUNTER — Other Ambulatory Visit (INDEPENDENT_AMBULATORY_CARE_PROVIDER_SITE_OTHER): Payer: Federal, State, Local not specified - PPO

## 2018-06-05 DIAGNOSIS — R945 Abnormal results of liver function studies: Secondary | ICD-10-CM

## 2018-06-05 DIAGNOSIS — R112 Nausea with vomiting, unspecified: Secondary | ICD-10-CM | POA: Diagnosis not present

## 2018-06-05 DIAGNOSIS — Z8619 Personal history of other infectious and parasitic diseases: Secondary | ICD-10-CM

## 2018-06-05 DIAGNOSIS — R1310 Dysphagia, unspecified: Secondary | ICD-10-CM | POA: Diagnosis not present

## 2018-06-05 DIAGNOSIS — R7989 Other specified abnormal findings of blood chemistry: Secondary | ICD-10-CM

## 2018-06-05 LAB — HEPATIC FUNCTION PANEL
ALK PHOS: 134 U/L — AB (ref 39–117)
ALT: 212 U/L — ABNORMAL HIGH (ref 0–35)
AST: 134 U/L — AB (ref 0–37)
Albumin: 3.9 g/dL (ref 3.5–5.2)
BILIRUBIN TOTAL: 1.3 mg/dL — AB (ref 0.2–1.2)
Bilirubin, Direct: 0.5 mg/dL — ABNORMAL HIGH (ref 0.0–0.3)
Total Protein: 7.7 g/dL (ref 6.0–8.3)

## 2018-06-05 LAB — IGA: IgA: 260 mg/dL (ref 68–378)

## 2018-06-05 LAB — CORTISOL: Cortisol, Plasma: 18.5 ug/dL

## 2018-06-05 LAB — AMYLASE: Amylase: 20 U/L — ABNORMAL LOW (ref 27–131)

## 2018-06-05 LAB — MAGNESIUM: Magnesium: 1.7 mg/dL (ref 1.5–2.5)

## 2018-06-05 LAB — PROTIME-INR
INR: 1.2 ratio — ABNORMAL HIGH (ref 0.8–1.0)
PROTHROMBIN TIME: 14.1 s — AB (ref 9.6–13.1)

## 2018-06-05 LAB — LIPASE: LIPASE: 18 U/L (ref 11.0–59.0)

## 2018-06-05 LAB — HIGH SENSITIVITY CRP: CRP, High Sensitivity: 28.38 mg/L — ABNORMAL HIGH (ref 0.000–5.000)

## 2018-06-08 LAB — TISSUE TRANSGLUTAMINASE, IGA: (tTG) Ab, IgA: 1 U/mL

## 2018-06-08 LAB — HEPATITIS A ANTIBODY, TOTAL: HEPATITIS A AB,TOTAL: NONREACTIVE

## 2018-06-08 LAB — HEPATITIS B SURFACE ANTIBODY,QUALITATIVE: Hep B S Ab: NONREACTIVE

## 2018-06-11 ENCOUNTER — Encounter: Payer: Self-pay | Admitting: Gastroenterology

## 2018-06-11 ENCOUNTER — Other Ambulatory Visit: Payer: Self-pay

## 2018-06-11 ENCOUNTER — Ambulatory Visit (AMBULATORY_SURGERY_CENTER): Payer: Federal, State, Local not specified - PPO | Admitting: Gastroenterology

## 2018-06-11 ENCOUNTER — Other Ambulatory Visit: Payer: Self-pay | Admitting: Gastroenterology

## 2018-06-11 VITALS — BP 168/102 | HR 67 | Temp 97.1°F | Resp 18 | Ht 61.0 in | Wt 187.0 lb

## 2018-06-11 DIAGNOSIS — R7989 Other specified abnormal findings of blood chemistry: Secondary | ICD-10-CM

## 2018-06-11 DIAGNOSIS — R945 Abnormal results of liver function studies: Secondary | ICD-10-CM

## 2018-06-11 DIAGNOSIS — R1319 Other dysphagia: Secondary | ICD-10-CM

## 2018-06-11 DIAGNOSIS — R1110 Vomiting, unspecified: Secondary | ICD-10-CM | POA: Diagnosis not present

## 2018-06-11 DIAGNOSIS — R1310 Dysphagia, unspecified: Secondary | ICD-10-CM

## 2018-06-11 DIAGNOSIS — K317 Polyp of stomach and duodenum: Secondary | ICD-10-CM

## 2018-06-11 HISTORY — PX: ESOPHAGOGASTRODUODENOSCOPY (EGD) WITH PROPOFOL: SHX5813

## 2018-06-11 MED ORDER — SODIUM CHLORIDE 0.9 % IV SOLN: 500.0000 mL | Freq: Once | INTRAVENOUS | Status: DC

## 2018-06-11 MED ORDER — CLOTRIMAZOLE 10 MG MT TROC: 10.0000 mg | Freq: Every day | OROMUCOSAL | 0 refills | Status: AC

## 2018-06-11 NOTE — Progress Notes (Signed)
Called to room to assist during endoscopic procedure.  Patient ID and intended procedure confirmed with present staff. Received instructions for my participation in the procedure from the performing physician.  

## 2018-06-11 NOTE — Progress Notes (Signed)
A and O x3. Report to RN. Tolerated MAC anesthesia well.Teeth unchanged after procedure.

## 2018-06-11 NOTE — Progress Notes (Signed)
Pt's states no medical or surgical changes since previsit or office visit. 

## 2018-06-11 NOTE — Patient Instructions (Signed)
Continue present medications. Continue Clotrimazole troche for next 5 days. Await pathology results.    YOU HAD AN ENDOSCOPIC PROCEDURE TODAY AT THE [facility]:   Refer to the procedure report that was given to you for any specific questions about what was found during the examination.  If the procedure report does not answer your questions, please call your gastroenterologist to clarify.  If you requested that your care partner not be given the details of your procedure findings, then the procedure report has been included in a sealed envelope for you to review at your convenience later.  YOU SHOULD EXPECT: Some feelings of bloating in the abdomen. Passage of more gas than usual.  Walking can help get rid of the air that was put into your GI tract during the procedure and reduce the bloating. If you had a lower endoscopy (such as a colonoscopy or flexible sigmoidoscopy) you may notice spotting of blood in your stool or on the toilet paper. If you underwent a bowel prep for your procedure, you may not have a normal bowel movement for a few days.  Please Note:  You might notice some irritation and congestion in your nose or some drainage.  This is from the oxygen used during your procedure.  There is no need for concern and it should clear up in a day or so.  SYMPTOMS TO REPORT IMMEDIATELY:    Following upper endoscopy (EGD)  Vomiting of blood or coffee ground material  New chest pain or pain under the shoulder blades  Painful or persistently difficult swallowing  New shortness of breath  Fever of 100F or higher  Black, tarry-looking stools  For urgent or emergent issues, a gastroenterologist can be reached at any hour by calling [phone].   DIET:  We do recommend a small meal at first, but then you may proceed to your regular diet.  Drink plenty of fluids but you should avoid alcoholic beverages for 24 hours.  ACTIVITY:  You should plan to take it easy for the rest  of today and you should NOT DRIVE or use heavy machinery until tomorrow (because of the sedation medicines used during the test).    FOLLOW UP: Our staff will call the number listed on your records the next business day following your procedure to check on you and address any questions or concerns that you may have regarding the information given to you following your procedure. If we do not reach you, we will leave a message.  However, if you are feeling well and you are not experiencing any problems, there is no need to return our call.  We will assume that you have returned to your regular daily activities without incident.  If any biopsies were taken you will be contacted by phone or by letter within the next 1-3 weeks.  Please call us at [phone] if you have not heard about the biopsies in 3 weeks.    SIGNATURES/CONFIDENTIALITY: You and/or your care partner have signed paperwork which will be entered into your electronic medical record.  These signatures attest to the fact that that the information above on your After Visit Summary has been reviewed and is understood.  Full responsibility of the confidentiality of this discharge information lies with you and/or your care-partner.

## 2018-06-11 NOTE — Op Note (Signed)
[place] Patient Name: Michele [name] Procedure Date: 06/11/2018 10:13 AM MRN: [id number] Endoscopist: Justice Britain , MD Age: 42 Referring MD:  Date of Birth: 1976/03/15 Gender: Female Account #: [id number] Procedure:                Upper GI endoscopy Indications:              Dysphagia, Odynophagia, Nausea with vomiting Medicines:                Monitored Anesthesia Care Procedure:                Pre-Anesthesia Assessment:                           - Prior to the procedure, a History and Physical                            was performed, and patient medications and                            allergies were reviewed. The patient's tolerance of                            previous anesthesia was also reviewed. The risks                            and benefits of the procedure and the sedation                            options and risks were discussed with the patient.                            All questions were answered, and informed consent                            was obtained. Prior Anticoagulants: The patient has                            taken no previous anticoagulant or antiplatelet                            agents. ASA Grade Assessment: I - A normal, healthy                            patient. After reviewing the risks and benefits,                            the patient was deemed in satisfactory condition to                            undergo the procedure.                           After obtaining informed consent, the endoscope was  passed under direct vision. Throughout the                            procedure, the patient's blood pressure, pulse, and                            oxygen saturations were monitored continuously. The                            Endoscope was introduced through the mouth, and                            advanced to the second part of duodenum. The upper                            GI endoscopy  was accomplished without difficulty.                            The patient tolerated the procedure. Scope In: Scope Out: Findings:                 No gross lesions were noted in the entire                            esophagus. Biopsies were taken with a cold forceps                            for histology to rule out Monolial esophagitis and                            for EoE. .                           The Z-line was regular and was found 35 cm from the                            incisors.                           Multiple diminutive sessile polyps were found in                            the gastric fundus. Biopsies were taken with a cold                            forceps for histology of 2 polyps to confirm fundic                            gland polyps.                           No gross lesions were noted in the entire examined                            stomach.  Biopsies were taken with a cold forceps                            for histology and Helicobacter pylori testing from                            the antrum/incisura/greater curve/lesser curve.                           No gross lesions were noted in the duodenal bulb,                            in the first portion of the duodenum and in the                            second portion of the duodenum. Biopsies were taken                            with a cold forceps for histology to rule out                            enteropathy. Complications:            No immediate complications. Estimated Blood Loss:     Estimated blood loss was minimal. Impression:               - No gross lesions in esophagus. Biopsied for rule                            out Monolial and for EoE.                           - Z-line regular, 35 cm from the incisors.                           - Multiple gastric polyps. Biopsied to rule out                            Adenoma though most consistent with fundic gland.                           - No  gross lesions in the stomach. Biopsied for HP.                           - No gross lesions in the duodenal bulb, in the                            first portion of the duodenum and in the second                            portion of the duodenum. Biopsied for enteropathy. Recommendation:           - The patient will be observed post-procedure,  until all discharge criteria are met.                           - Discharge patient to home.                           - Patient has a contact number available for                            emergencies. The signs and symptoms of potential                            delayed complications were discussed with the                            patient. Return to normal activities tomorrow.                            Written discharge instructions were provided to the                            patient.                           - Patient has a contact number available for                            emergencies. The signs and symptoms of potential                            delayed complications were discussed with the                            patient. Return to normal activities tomorrow.                            Written discharge instructions were provided to the                            patient.                           - Resume previous diet.                           - Await pathology results.                           - OK to continue Clotrimazole troche for next                            5-days and then would stop (biopsies should be back                            by next week to confirm no longer required or other  step-up therapy).                           - The findings and recommendations were discussed                            with the patient.                           - The findings and recommendations were discussed                            with the patient's  family. Justice Britain, MD 06/11/2018 10:33:50 AM

## 2018-06-12 ENCOUNTER — Telehealth: Payer: Self-pay

## 2018-06-12 NOTE — Telephone Encounter (Signed)
  Follow up Call-  Call Michele [name] number 06/11/2018  Post procedure Call Michele [name] phone  # [phone]  Permission to leave phone message Yes  Some recent data might be hidden     Patient questions:  Do you have a fever, pain , or abdominal swelling? No. Pain Score  0 *  Have you tolerated food without any problems? Yes.    Have you been able to return to your normal activities? Yes.    Do you have any questions about your discharge instructions: Diet   No. Medications  No. Follow up visit  No.  Do you have questions or concerns about your Care? No.  Actions: * If pain score is 4 or above: No action needed, pain <4.  Pt states her throat is a little scratchy from procedure yesterday. I told her this is normal and expected. Instructed patient to take tylenol.

## 2018-06-19 ENCOUNTER — Telehealth: Payer: Self-pay

## 2018-06-19 ENCOUNTER — Other Ambulatory Visit: Payer: Self-pay | Admitting: Gastroenterology

## 2018-06-19 DIAGNOSIS — R1110 Vomiting, unspecified: Secondary | ICD-10-CM

## 2018-06-19 DIAGNOSIS — R05 Cough: Secondary | ICD-10-CM

## 2018-06-19 DIAGNOSIS — R059 Cough, unspecified: Secondary | ICD-10-CM

## 2018-06-19 MED ORDER — CLOTRIMAZOLE 10 MG MT TROC: 10.0000 mg | Freq: Every day | OROMUCOSAL | 0 refills | Status: AC

## 2018-06-19 NOTE — Telephone Encounter (Signed)
The pt verbalized that she does NOT want a referral to [facility]NT, anywhere else will be fine.  I have made the referral.  Dr Meridee Score can you please clarify the sentence regarding fluconazole?

## 2018-06-19 NOTE — Telephone Encounter (Signed)
-----   Message from Lemar Lofty., MD sent at 06/19/2018  1:35 PM EDT ----- Regarding: Patient needs Kodah Maret, Let's get patient placed for an ENT referral for abnormal mucous production and evaluation (since we have her on high dose acid suppression and she is still having issues making GERD/LAR less likely). I have sent a Rx for Clotrimazole. I'll decide after patient returns message to consider a 10-day course of Fluconazole to one/for-all get out of the question candida playing a role, but I'm not sure yet either. If she needs a refill of Anti-Emetic that is OK as well. Thanks.  Liz Beach

## 2018-06-19 NOTE — Telephone Encounter (Signed)
Left message on machine to call back  

## 2018-06-21 ENCOUNTER — Encounter: Payer: Self-pay | Admitting: Gastroenterology

## 2018-06-22 ENCOUNTER — Telehealth: Payer: Self-pay | Admitting: Gastroenterology

## 2018-06-22 NOTE — Telephone Encounter (Signed)
The referral has been made and the pt aware to call if no response in 1 week

## 2018-06-24 ENCOUNTER — Telehealth: Payer: Self-pay | Admitting: Gastroenterology

## 2018-06-24 NOTE — Telephone Encounter (Signed)
The pt has been advised again that referral was made and will be called with an appt.

## 2018-06-26 ENCOUNTER — Other Ambulatory Visit (INDEPENDENT_AMBULATORY_CARE_PROVIDER_SITE_OTHER): Payer: Federal, State, Local not specified - PPO

## 2018-06-26 DIAGNOSIS — R945 Abnormal results of liver function studies: Secondary | ICD-10-CM

## 2018-06-26 DIAGNOSIS — R7989 Other specified abnormal findings of blood chemistry: Secondary | ICD-10-CM

## 2018-06-26 LAB — HEPATIC FUNCTION PANEL
ALT: 134 U/L — ABNORMAL HIGH (ref 0–35)
AST: 125 U/L — ABNORMAL HIGH (ref 0–37)
Albumin: 3.8 g/dL (ref 3.5–5.2)
Alkaline Phosphatase: 129 U/L — ABNORMAL HIGH (ref 39–117)
BILIRUBIN TOTAL: 0.7 mg/dL (ref 0.2–1.2)
Bilirubin, Direct: 0.2 mg/dL (ref 0.0–0.3)
Total Protein: 7.4 g/dL (ref 6.0–8.3)

## 2018-06-26 LAB — CBC WITH DIFFERENTIAL/PLATELET
BASOS ABS: 0 10*3/uL (ref 0.0–0.1)
Basophils Relative: 0.6 % (ref 0.0–3.0)
Eosinophils Absolute: 0 10*3/uL (ref 0.0–0.7)
Eosinophils Relative: 0.6 % (ref 0.0–5.0)
HEMATOCRIT: 32.5 % — AB (ref 36.0–46.0)
Hemoglobin: 10.9 g/dL — ABNORMAL LOW (ref 12.0–15.0)
Lymphocytes Relative: 32.6 % (ref 12.0–46.0)
Lymphs Abs: 2.5 10*3/uL (ref 0.7–4.0)
MCHC: 33.6 g/dL (ref 30.0–36.0)
MCV: 87.5 fl (ref 78.0–100.0)
MONOS PCT: 6.7 % (ref 3.0–12.0)
Monocytes Absolute: 0.5 10*3/uL (ref 0.1–1.0)
NEUTROS PCT: 59.5 % (ref 43.0–77.0)
Neutro Abs: 4.5 10*3/uL (ref 1.4–7.7)
Platelets: 490 10*3/uL — ABNORMAL HIGH (ref 150.0–400.0)
RBC: 3.72 Mil/uL — AB (ref 3.87–5.11)
RDW: 18.5 % — ABNORMAL HIGH (ref 11.5–15.5)
WBC: 7.6 10*3/uL (ref 4.0–10.5)

## 2018-06-26 LAB — BASIC METABOLIC PANEL
BUN: 10 mg/dL (ref 6–23)
CALCIUM: 9.2 mg/dL (ref 8.4–10.5)
CO2: 25 mEq/L (ref 19–32)
Chloride: 104 mEq/L (ref 96–112)
Creatinine, Ser: 0.61 mg/dL (ref 0.40–1.20)
GFR: 139 mL/min (ref >60.00)
GLUCOSE: 87 mg/dL (ref 70–99)
Potassium: 3 mEq/L — ABNORMAL LOW (ref 3.5–5.1)
SODIUM: 141 meq/L (ref 135–145)

## 2018-06-26 LAB — HIGH SENSITIVITY CRP: CRP, High Sensitivity: 27.63 mg/L — ABNORMAL HIGH (ref 0.000–5.000)

## 2018-06-26 LAB — PROTIME-INR
INR: 1.2 ratio — ABNORMAL HIGH (ref 0.8–1.0)
Prothrombin Time: 13.8 s — ABNORMAL HIGH (ref 9.6–13.1)

## 2018-06-26 LAB — MAGNESIUM: Magnesium: 1.9 mg/dL (ref 1.5–2.5)

## 2018-07-01 ENCOUNTER — Other Ambulatory Visit: Payer: Self-pay

## 2018-07-01 DIAGNOSIS — R7989 Other specified abnormal findings of blood chemistry: Secondary | ICD-10-CM

## 2018-07-01 DIAGNOSIS — R945 Abnormal results of liver function studies: Secondary | ICD-10-CM

## 2018-07-06 ENCOUNTER — Ambulatory Visit (HOSPITAL_COMMUNITY)
Admission: RE | Admit: 2018-07-06 | Discharge: 2018-07-06 | Disposition: A | Discharge status: Home / Self Care | Payer: Federal, State, Local not specified - PPO | Source: Ambulatory Visit | Attending: Gastroenterology | Admitting: Gastroenterology

## 2018-07-06 DIAGNOSIS — R7989 Other specified abnormal findings of blood chemistry: Secondary | ICD-10-CM

## 2018-07-06 DIAGNOSIS — R160 Hepatomegaly, not elsewhere classified: Secondary | ICD-10-CM | POA: Insufficient documentation

## 2018-07-06 DIAGNOSIS — R945 Abnormal results of liver function studies: Secondary | ICD-10-CM | POA: Diagnosis not present

## 2018-07-17 ENCOUNTER — Other Ambulatory Visit (INDEPENDENT_AMBULATORY_CARE_PROVIDER_SITE_OTHER): Payer: Federal, State, Local not specified - PPO

## 2018-07-17 DIAGNOSIS — R7989 Other specified abnormal findings of blood chemistry: Secondary | ICD-10-CM

## 2018-07-17 DIAGNOSIS — R945 Abnormal results of liver function studies: Secondary | ICD-10-CM

## 2018-07-17 LAB — BASIC METABOLIC PANEL
BUN: 8 mg/dL (ref 6–23)
CO2: 25 mEq/L (ref 19–32)
CREATININE: 0.59 mg/dL (ref 0.40–1.20)
Calcium: 9.1 mg/dL (ref 8.4–10.5)
Chloride: 103 mEq/L (ref 96–112)
GFR: 144.41 mL/min (ref >60.00)
Glucose, Bld: 99 mg/dL (ref 70–99)
POTASSIUM: 3.5 meq/L (ref 3.5–5.1)
Sodium: 137 mEq/L (ref 135–145)

## 2018-07-17 LAB — HEPATIC FUNCTION PANEL
ALBUMIN: 4 g/dL (ref 3.5–5.2)
ALT: 50 U/L — ABNORMAL HIGH (ref 0–35)
AST: 55 U/L — ABNORMAL HIGH (ref 0–37)
Alkaline Phosphatase: 134 U/L — ABNORMAL HIGH (ref 39–117)
Bilirubin, Direct: 0 mg/dL (ref 0.0–0.3)
TOTAL PROTEIN: 7.8 g/dL (ref 6.0–8.3)
Total Bilirubin: 0.4 mg/dL (ref 0.2–1.2)

## 2018-07-17 LAB — PROTIME-INR
INR: 1.1 ratio — AB (ref 0.8–1.0)
PROTHROMBIN TIME: 13.3 s — AB (ref 9.6–13.1)

## 2018-07-21 ENCOUNTER — Encounter: Payer: Self-pay | Admitting: Gastroenterology

## 2018-07-21 ENCOUNTER — Ambulatory Visit: Payer: Federal, State, Local not specified - PPO | Admitting: Gastroenterology

## 2018-07-21 VITALS — BP 140/90 | HR 86 | Ht 61.0 in | Wt 186.6 lb

## 2018-07-21 DIAGNOSIS — R945 Abnormal results of liver function studies: Secondary | ICD-10-CM

## 2018-07-21 DIAGNOSIS — R7989 Other specified abnormal findings of blood chemistry: Secondary | ICD-10-CM

## 2018-07-21 DIAGNOSIS — K219 Gastro-esophageal reflux disease without esophagitis: Secondary | ICD-10-CM | POA: Diagnosis not present

## 2018-07-21 MED ORDER — ESOMEPRAZOLE MAGNESIUM 40 MG PO CPDR: 40.0000 mg | DELAYED_RELEASE_CAPSULE | Freq: Every day | ORAL | 3 refills | Status: DC

## 2018-07-21 NOTE — Patient Instructions (Signed)
Normal BMI (Body Mass Index- based on height and weight) is between 19 and 25. Your BMI today is Body mass index is 35.26 kg/m. Marland Kitchen. Please consider follow up  regarding your BMI with your Primary Care Provider.  We have sent the following medications to your pharmacy for you to pick up at your convenience: Nexium  You will be contacted by our office to schedule a follow up appointment for February 2020  Thank you for entrusting me with your care and choosing [facility].  Dr Meridee ScoreMansouraty

## 2018-07-22 LAB — ANTI-SMOOTH MUSCLE ANTIBODY, IGG: Actin (Smooth Muscle) Antibody (IGG): <20 U (ref <20)

## 2018-07-22 LAB — ALLERGEN, CORN, IGG4: Allergen, Corn, IgG4: <0.15 ug/mL

## 2018-07-22 LAB — MITOCHONDRIAL ANTIBODIES

## 2018-07-22 LAB — IGG, IGA, IGM
IMMUNOGLOBULIN A: 235 mg/dL (ref 47–310)
IgG (Immunoglobin G), Serum: 1074 mg/dL (ref 600–1640)
IgM, Serum: 163 mg/dL (ref 50–300)

## 2018-07-23 DIAGNOSIS — K219 Gastro-esophageal reflux disease without esophagitis: Secondary | ICD-10-CM | POA: Insufficient documentation

## 2018-07-23 NOTE — Progress Notes (Signed)
[place] VISIT   Primary Care Provider Glendon Axe, [place] [address] [phone]  Referring Provider Glendon Axe, MD [address] [place], [place] [address] [phone]  Patient Profile: Michele ISHII is a 42 y.o. female with a pmh significant for recent Sensorineural Hearing Loss, Allergies, anxiety, obesity.  The patient presents to the [facility] for an evaluation and management of problem(s) noted below:  Problem List 1. Gastroesophageal reflux disease, esophagitis presence not specified   2. Abnormal liver function test     History of Present Illness: Please see prior consultation note for full details of history of present illness.  In brief, this patient was initially seen for issues of recurrent nausea as well as vomiting and mucus production.  She also was found to have abnormal liver biochemical testing.  She had one-point had been given steroids and developed thrush.  When initially seen we were concerned about the possibility of Candida esophagitis.  We initiated her on clotrimazole atrocious while awaiting an endoscopy.  An endoscopy was performed in October.  The endoscopy showed, no gross lesions in the entire esophagus biopsies were taken for monilial esophagitis and EOE rule out.  Z line was regular at 35 cm.  Multiple diminutive sessile polyps were found in the gastric fundus and sampled.  No gross lesions in the entire stomach otherwise with biopsies obtained for H. pylori.  No gross lesions in the duodenal bulb first portion of duodenum or second portion of the duodenum.  Biopsies were obtained to rule out celiac and enteropathy.  The biopsies from duodenum were normal.  The biopsies from the stomach showed mild chronic gastritis but no evidence of intestinal metaplasia or H. pylori.  The gastric polyps were significant for active gastritis but no intestinal metaplasia.  The esophageal biopsies  showed some increased intraepithelial lymphocytes however did not meet criteria for lymphocytic esophagitis or any findings of Candida.  At the time of me seeing her for her endoscopy the patient was feeling much better.  However after her endoscopy had been completed she continued to have wavering symptoms of nausea as well as issues with mucus production.  She was referred to ear nose throat physicians who felt that she did not have any significant issues and thought that her symptoms were due to GERD.  She was maintained on PPI.  We filled out FMLA for her from October into November.  She is here for follow-up.  We have followed her liver tests which had been slightly downtrending but were not normalizing.  She obtained labs prior to today's visit.  Interval History Today, the patient presents for scheduled follow-up.  She has been doing well.  She still has some mucus production however it is not to the extent of what it has been while she is been on PPI therapy.  She is taking the medication as prescribed.  She has not had a significant amount of nausea or vomiting and she has not used all of her antinausea medications.  Her liver biochemical testing has improved slightly but has not normalized.  We are awaiting the AMA as well as anti-smooth muscle antibody.  She has not had any significant alcohol consumption over this time.  She is hopeful that we do not need to consider a liver biopsy as we had previously discussed.  Her appetite remains the same.  Weight is unchanged.  She still has bloating.  She does not feel that she has  had any recurrent evidence of thrush.  GI Review of Systems Positive as above Negative for change in bowel habits, melena, hematochezia, abdominal pain  Review of Systems General: Denies fevers/chills/night sweats HEENT: Denies oral lesions Cardiovascular: Denies chest pain Pulmonary: Denies shortness of breath Gastroenterological: See HPI Dermatological: Denies jaundice  or new skin rashes Psychological: Mood is stable to improved since last visit Musculoskeletal: Denies new arthralgias   Medications Current Outpatient Medications  Medication Sig Dispense Refill  . Iron 15 MG/1.5ML SUSP Take by mouth daily.    Lenda Kelp FE 1.5/30 1.5-30 MG-MCG tablet Take 1 tablet by mouth daily.  11  . esomeprazole (NEXIUM) 40 MG capsule Take 1 capsule (40 mg total) by mouth daily at 12 noon. 30 capsule 3  . ferrous sulfate 325 (65 FE) MG tablet Take 1 tablet (325 mg total) by mouth daily with breakfast. 30 tablet 11  . potassium chloride (K-DUR) 10 MEQ tablet Take 3 tablets (30 mEq total) by mouth daily for 5 days. 15 tablet 0   No current facility-administered medications for this visit.     Allergies Allergies  Allergen Reactions  . Naproxen Hives  . Penicillins Hives, Swelling and Other (See Comments)    Childhood Allergy Has patient had a PCN reaction causing immediate rash, facial/tongue/throat swelling, SOB or lightheadedness with hypotension: Yes Has patient had a PCN reaction causing severe rash involving mucus membranes or skin necrosis: No Has patient had a PCN reaction that required hospitalization: No Has patient had a PCN reaction occurring within the last 10 years: Unknown If all of the above answers are "NO", then may proceed with Cephalosporin use.     Histories Past Medical History:  Diagnosis Date  . Allergy   . Elevated blood pressure reading without diagnosis of hypertension   . Hearing loss 03/2018  . Vitamin D deficiency    Past Surgical History:  Procedure Laterality Date  . CESAREAN SECTION  06/09/98 & 04/15/05   x 2   Social History   Socioeconomic History  . Marital status: Married    Spouse name: Not on file  . Number of children: 2  . Years of education: Not on file  . Highest education level: Not on file  Occupational History    Employer: [employer]  . Financial resource strain: Not on file  .  Food insecurity:    Worry: Not on file    Inability: Not on file  . Transportation needs:    Medical: Not on file    Non-medical: Not on file  Tobacco Use  . Smoking status: Never Smoker  . Smokeless tobacco: Never Used  Substance and Sexual Activity  . Alcohol use: Not Currently  . Drug use: No  . Sexual activity: Yes    Birth control/protection: Pill  Lifestyle  . Physical activity:    Days per week: Not on file    Minutes per session: Not on file  . Stress: Not on file  Relationships  . Social connections:    Talks on phone: Not on file    Gets together: Not on file    Attends religious service: Not on file    Active member of club or organization: Not on file    Attends meetings of clubs or organizations: Not on file    Relationship status: Not on file  . Intimate partner violence:    Fear of current or ex partner: Not on file    Emotionally abused: Not  on file    Physically abused: Not on file    Forced sexual activity: Not on file  Other Topics Concern  . Not on file  Social History Narrative   Caffeine use: sweet tea once a day   regular exercise:  Just joined gym.   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   Married   2 daughters- born 63 and 2006.         Family History  Problem Relation Age of Onset  . Hypertension Mother   . Hypertension Father   . Hypertension Maternal Grandmother   . Diabetes Maternal Grandmother   . Hypertension Paternal Grandmother   . Hypertension Paternal Grandfather   . Hypertension Sister   . Colon cancer Neg Hx   . Esophageal cancer Neg Hx   . Inflammatory bowel disease Neg Hx   . Liver disease Neg Hx   . Pancreatic cancer Neg Hx   . Rectal cancer Neg Hx   . Stomach cancer Neg Hx    I have reviewed her medical, social, and family history in detail and updated the electronic medical record as necessary.    PHYSICAL EXAMINATION  BP 140/90   Pulse 86   Ht _0  (1.549 m)    Wt 186 lb 9.6 oz (84.6 kg)   LMP 06/28/2009 (Exact Date)   SpO2 98%   BMI 35.26 kg/m  Wt Readings from Last 3 Encounters:  07/21/18 186 lb 9.6 oz (84.6 kg)  06/11/18 187 lb (84.8 kg)  06/02/18 187 lb (84.8 kg)  GEN: NAD, appears stated age, doesn't appear chronically ill, accompanied by husband today PSYCH: Cooperative, without pressured speech EYE: Conjunctivae pink, sclerae anicteric ENT: MMM, tongue without evidence of any whitish hue or thrush CV: RR without R/Gs  RESP: CTAB posteriorly, without wheezing GI: NABS, soft, minimal tenderness to palpation in midepigastrium, without rebound or guarding, no hepatosplenomegaly appreciated  MSK/EXT: No lower extremity edema, no palmar erythema SKIN: No jaundice, no spider angiomata on upper thorax NEURO:  Alert & Oriented x 3, no focal deficits, no evidence of asterixis    REVIEW OF DATA  I reviewed the following data at the time of this encounter:  GI Procedures and Studies  June 11, 2018 EGD - No gross lesions in esophagus. Biopsied for rule out Monolial and for EoE. - Z-line regular, 35 cm from the incisors. - Multiple gastric polyps. Biopsied to rule out Adenoma though most consistent with fundic gland. - No gross lesions in the stomach. Biopsied for HP. - No gross lesions in the duodenal bulb, in the first portion of the duodenum and in the second portion of the duodenum. Biopsied for enteropathy.  Laboratory Studies    06/05/2018 08:14 06/26/2018 08:01 07/17/2018 16:50  AST 134 (H) 125 (H) 55 (H)  ALT 212 (H) 134 (H) 50 (H)  Total Protein 7.7 7.4 7.8  Bilirubin, Direct 0.5 (H) 0.2 0.0  Total Bilirubin 1.3 (H) 0.7 0.4   Outside laboratories reviewed September 2019 screening urine drug test negative for THC, benzos, opioids, TCAs September 2019 urine pregnancy negative September 2019 urine culture mixed flora  May 26, 2018 Hepatitis A IgM negative Hepatitis B core IgM negative Hepatitis C antibody  negative Hepatitis B surface antigen negative Triglycerides 91 Cholesterol 260 Ferritin 49 (within normal limits) TSH 0.827 (within normal limits) Hemoglobin over hematocrit 11.3/34 MCV 85 Platelets 453 (above upper limit of normal) A1c 5.6 Potassium 3.3 Anion gap 12 CO2 25 Albumin  4.1 Creatinine 0.9 Bilirubin total 1.3 AST/ALT 237/383 Alk phos 120 Magnesium 1.9  April 02, 2018 AST/ALT 37/53 T bili 0.4 Alk phos 117 ANA negative Rheumatoid factor XI (within normal limits)  Dec 31, 2017 Platelets 627,000 Hemoglobin 11.1 Hematocrit 35.6 MCV 86 HIV negative AST/ALT 24/33 T bili 0.6 Alk phos 170  Imaging Studies  No new studies   ASSESSMENT  Ms. Kyer is a 41 y.o. female  with a pmh significant for recent Sensorineural Hearing Loss, Allergies, anxiety, obesity.  The patient is seen today for evaluation and management of:  1. Gastroesophageal reflux disease, esophagitis presence not specified   2. Abnormal liver function test    The patient presents today and has been improving slowly but is now back at work and no longer having significant nausea or vomiting from the mucus production she is experienced.  Again is not clear to me that all of her symptoms are related to acid reflux and we did discuss the possibility down the road of manometric studies with pH impedance testing.  Not sure that required currently.  However we will maintain her on her current dose of PPI to try and keep things at [place] for the next few months.  Her liver biochemical profile has continued to improve as well over the course of the last 2 months but is not normalized.  I am waiting for anti-smooth muscle antibody to help me delineate whether this patient could be harboring autoimmune hepatitis.  If it is positive we will consider a liver biopsy to understand overall amount of activity and possible fibrosis/scarring that may be present.  If the anti-smooth muscle antibody is negative then we will plan  for follow-up labs in approximately 6 weeks so we can see if things have continued to normalize.  I will hold off on liver biopsy at that point 4.  Of 6 to 12 months if her anti-smooth muscle antibody is negative and if her liver tests continue to improve or normalize.  Given the etiology of this currently is not completely clear but suggest that there was some sort of viral process that may have been ongoing.  I will determine timing of repeat liver tests or liver biopsy based on the findings of her recent labs done prior to today's visit.  All patient questions were answered, to the best of my ability, and the patient agrees to the aforementioned plan of action with follow-up as indicated.   PLAN  1. Gastroesophageal reflux disease, esophagitis presence not specified - Continue Nexium 40 mg QD - Hold on pH impedence testing/manometry (reasoning to consider is that these are atypical symptoms in regards to the sore throat and mucous production)  2. Abnormal LFTs - Follow up pending liver testing including ASMA, AMA - Determine need for Liver biopsy vs close monitoring based on these labs and based on her overall clinical trajectory - Will consider getting her immunized to HAV & HBV in the future   No orders of the defined types were placed in this encounter.   New Prescriptions   ESOMEPRAZOLE (NEXIUM) 40 MG CAPSULE    Take 1 capsule (40 mg total) by mouth daily at 12 noon.   Modified Medications   No medications on file    Planned Follow Up: No follow-ups on file.   Justice Britain, MD [facility] Office # [phone]

## 2018-07-24 ENCOUNTER — Other Ambulatory Visit: Payer: Self-pay

## 2018-07-24 DIAGNOSIS — R7989 Other specified abnormal findings of blood chemistry: Secondary | ICD-10-CM

## 2018-07-24 DIAGNOSIS — R945 Abnormal results of liver function studies: Principal | ICD-10-CM

## 2018-08-31 ENCOUNTER — Other Ambulatory Visit (INDEPENDENT_AMBULATORY_CARE_PROVIDER_SITE_OTHER): Payer: Federal, State, Local not specified - PPO

## 2018-08-31 DIAGNOSIS — R945 Abnormal results of liver function studies: Secondary | ICD-10-CM | POA: Diagnosis not present

## 2018-08-31 DIAGNOSIS — R7989 Other specified abnormal findings of blood chemistry: Secondary | ICD-10-CM

## 2018-08-31 LAB — HEPATIC FUNCTION PANEL
ALT: 38 U/L — AB (ref 0–35)
AST: 19 U/L (ref 0–37)
Albumin: 3.9 g/dL (ref 3.5–5.2)
Alkaline Phosphatase: 148 U/L — ABNORMAL HIGH (ref 39–117)
BILIRUBIN DIRECT: 0 mg/dL (ref 0.0–0.3)
BILIRUBIN TOTAL: 0.3 mg/dL (ref 0.2–1.2)
Total Protein: 7.5 g/dL (ref 6.0–8.3)

## 2018-09-04 ENCOUNTER — Other Ambulatory Visit: Payer: Self-pay

## 2018-09-04 DIAGNOSIS — R7989 Other specified abnormal findings of blood chemistry: Secondary | ICD-10-CM

## 2018-09-04 DIAGNOSIS — R945 Abnormal results of liver function studies: Principal | ICD-10-CM

## 2018-09-10 ENCOUNTER — Ambulatory Visit: Payer: Federal, State, Local not specified - PPO | Admitting: Gastroenterology

## 2018-10-16 ENCOUNTER — Other Ambulatory Visit (INDEPENDENT_AMBULATORY_CARE_PROVIDER_SITE_OTHER): Payer: Federal, State, Local not specified - PPO

## 2018-10-16 DIAGNOSIS — R7989 Other specified abnormal findings of blood chemistry: Secondary | ICD-10-CM

## 2018-10-16 DIAGNOSIS — R945 Abnormal results of liver function studies: Secondary | ICD-10-CM | POA: Diagnosis not present

## 2018-10-16 LAB — HEPATIC FUNCTION PANEL
ALT: 16 U/L (ref 0–35)
AST: 16 U/L (ref 0–37)
Albumin: 4 g/dL (ref 3.5–5.2)
Alkaline Phosphatase: 131 U/L — ABNORMAL HIGH (ref 39–117)
BILIRUBIN DIRECT: 0.1 mg/dL (ref 0.0–0.3)
TOTAL PROTEIN: 7.8 g/dL (ref 6.0–8.3)
Total Bilirubin: 0.4 mg/dL (ref 0.2–1.2)

## 2018-10-21 ENCOUNTER — Encounter: Payer: Self-pay | Admitting: Gastroenterology

## 2018-10-21 ENCOUNTER — Ambulatory Visit: Payer: Federal, State, Local not specified - PPO | Admitting: Gastroenterology

## 2018-10-21 VITALS — BP 130/72 | HR 76 | Ht 61.0 in | Wt 194.2 lb

## 2018-10-21 DIAGNOSIS — R1310 Dysphagia, unspecified: Secondary | ICD-10-CM | POA: Diagnosis not present

## 2018-10-21 DIAGNOSIS — K760 Fatty (change of) liver, not elsewhere classified: Secondary | ICD-10-CM

## 2018-10-21 DIAGNOSIS — R945 Abnormal results of liver function studies: Secondary | ICD-10-CM | POA: Diagnosis not present

## 2018-10-21 DIAGNOSIS — R7989 Other specified abnormal findings of blood chemistry: Secondary | ICD-10-CM

## 2018-10-21 DIAGNOSIS — K219 Gastro-esophageal reflux disease without esophagitis: Secondary | ICD-10-CM | POA: Diagnosis not present

## 2018-10-21 MED ORDER — ESOMEPRAZOLE MAGNESIUM 20 MG PO CPDR: 20.0000 mg | DELAYED_RELEASE_CAPSULE | Freq: Every day | ORAL | 1 refills | Status: DC

## 2018-10-21 NOTE — Progress Notes (Signed)
GASTROENTEROLOGY OUTPATIENT CLINIC VISIT   Primary Care Provider Caffie Damme, MD [address] [place] [address] [phone]  Patient Profile: Michele [name] is a 43 y.o. female with a pmh significant for recent Sensorineural Hearing Loss, Allergies, anxiety, obesity, HTN, GERD, Fatty Liver.  The patient presents to the [facility] for an evaluation and management of problem(s) noted below:  Problem List 1. Abnormal liver function test   2. Gastroesophageal reflux disease, esophagitis presence not specified   3. Fatty liver   4. Dysphagia, unspecified type     History of Present Illness: Please see initial consultation note for full details of HPI.  Interval History The patient has done well since our last clinic visit.  She has not had any recurrent symptoms.  She remains on PPI and would like to know how long she will need to be on this.  She had liver test checked last week before her clinic visit and we have reviewed those as outlined below.  Her appetite is improving and her weight is increasing slightly.  She no longer has any bloating.  She is not had any more thrush.  She wonders whether prednisone should be listed as an allergy because it caused all of her symptoms.  She denies any significant abdominal pain or nausea or vomiting.  She wonders whether there are any issues with her oral contraceptive since the side effect profile shows if a person has liver disease to check with her doctors about use of this medication.  The patient has had to initiate antihypertensive medication. GI Review of Systems Positive as above Negative for early satiety, odynophagia, dysphagia, melena, hematochezia   Review of Systems General: Denies fevers/chills HEENT: Denies recurrence of oral lesions Cardiovascular: Denies chest pain Pulmonary: Denies shortness of breath Gastroenterological: See HPI Dermatological: Denies jaundice Psychological: Mood is  stable   Medications Current Outpatient Medications  Medication Sig Dispense Refill  . hydrochlorothiazide (MICROZIDE) 12.5 MG capsule Take 12.5 mg by mouth daily.    . Iron 15 MG/1.5ML SUSP Take by mouth daily.    Colleen Can FE 1.5/30 1.5-30 MG-MCG tablet Take 1 tablet by mouth daily.  11  . esomeprazole (NEXIUM) 20 MG capsule Take 1 capsule (20 mg total) by mouth daily at 12 noon. 90 capsule 1   No current facility-administered medications for this visit.     Allergies Allergies  Allergen Reactions  . Naproxen Hives  . Penicillins Hives, Swelling and Other (See Comments)    Childhood Allergy Has patient had a PCN reaction causing immediate rash, facial/tongue/throat swelling, SOB or lightheadedness with hypotension: Yes Has patient had a PCN reaction causing severe rash involving mucus membranes or skin necrosis: No Has patient had a PCN reaction that required hospitalization: No Has patient had a PCN reaction occurring within the last 10 years: Unknown If all of the above answers are "NO", then may proceed with Cephalosporin use.     Histories Past Medical History:  Diagnosis Date  . Allergy   . Elevated blood pressure reading without diagnosis of hypertension   . Hearing loss 03/2018  . History of thrush 06/03/2018  . Vitamin D deficiency    Past Surgical History:  Procedure Laterality Date  . CESAREAN SECTION  06/09/98 & 04/15/05   x 2   Social History   Socioeconomic History  . Marital status: Married    Spouse name: Not on file  . Number of children: 2  . Years of education: Not on  file  . Highest education level: Not on file  Occupational History    Employer: [employer]   Social Needs  . Financial resource strain: Not on file  . Food insecurity:    Worry: Not on file    Inability: Not on file  . Transportation needs:    Medical: Not on file    Non-medical: Not on file  Tobacco Use  . Smoking status: Never Smoker  . Smokeless tobacco: Never Used   Substance and Sexual Activity  . Alcohol use: Not Currently  . Drug use: No  . Sexual activity: Yes    Birth control/protection: Pill  Lifestyle  . Physical activity:    Days per week: Not on file    Minutes per session: Not on file  . Stress: Not on file  Relationships  . Social connections:    Talks on phone: Not on file    Gets together: Not on file    Attends religious service: Not on file    Active member of club or organization: Not on file    Attends meetings of clubs or organizations: Not on file    Relationship status: Not on file  . Intimate partner violence:    Fear of current or ex partner: Not on file    Emotionally abused: Not on file    Physically abused: Not on file    Forced sexual activity: Not on file  Other Topics Concern  . Not on file  Social History Narrative   Caffeine use: sweet tea once a day   regular exercise:  Just joined gym.   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   Married   2 daughters- born [id number] and 2006.         Family History  Problem Relation Age of Onset  . Hypertension Mother   . Hypertension Father   . Hypertension Maternal Grandmother   . Diabetes Maternal Grandmother   . Hypertension Paternal Grandmother   . Hypertension Paternal Grandfather   . Hypertension Sister   . Colon cancer Neg Hx   . Esophageal cancer Neg Hx   . Inflammatory bowel disease Neg Hx   . Liver disease Neg Hx   . Pancreatic cancer Neg Hx   . Rectal cancer Neg Hx   . Stomach cancer Neg Hx    I have reviewed her medical, social, and family history in detail and updated the electronic medical record as necessary.    PHYSICAL EXAMINATION  BP 130/72   Pulse 76   Ht 5\' 1"  (1.549 m)   Wt 194 lb 3.2 oz (88.1 kg)   BMI 36.69 kg/m  Wt Readings from Last 3 Encounters:  10/21/18 194 lb 3.2 oz (88.1 kg)  07/21/18 186 lb 9.6 oz (84.6 kg)  06/11/18 187 lb (84.8 kg)  GEN: NAD, appears stated age, doesn't  appear chronically ill, accompanied by husband today PSYCH: Cooperative, without pressured speech EYE: Conjunctivae pink, sclerae anicteric ENT: MMM, tongue without evidence of any whitish hue or thrush CV: RR without R/Gs  RESP: CTAB posteriorly, without wheezing GI: NABS, soft, minimal tenderness to palpation in midepigastrium, without rebound or guarding, no hepatosplenomegaly appreciated  MSK/EXT: No lower extremity edema, no palmar erythema SKIN: No jaundice, no spider angiomata on upper thorax NEURO:  Alert & Oriented x 3, no focal deficits, no evidence of asterixis    REVIEW OF DATA  I reviewed the following data at the time of this encounter:  GI Procedures and Studies  No new studies to review  Laboratory Studies  Reviewed in epic  Imaging Studies  No new studies to review   ASSESSMENT  Ms. Rosebrough is a 43 y.o. female with a pmh significant for recent Sensorineural Hearing Loss, Allergies, anxiety, obesity, HTN, GERD, Fatty Liver.  The patient is seen today for evaluation and management of:  1. Abnormal liver function test   2. Gastroesophageal reflux disease, esophagitis presence not specified   3. Fatty liver   4. Dysphagia, unspecified type    The patient is hemodynamically and clinically stable.  Her liver tests continue to improve slowly over time.  I think with a near normalization of her numbers that we can hopefully hold off on a liver biopsy.  She has had to be initiated on antihypertensive medication.  If her liver tests remain abnormal I suspect the more likely etiology will end up being fatty liver as her liver ultrasound suggests and with her having multiple risk factors for fatty liver.  For now we will hold off on a liver biopsy but consider it if things persist after a year of abnormal liver tests.  The patient's GERD symptoms have continued to improve and I think that we will be able to hopefully get her off of acid reducing medications over the course the  next few months.  We will plan for her to continue once daily 40 mg PPI to the end of this month and then starting next month she can decrease to once daily 20 mg PPI.  She will continue this until her next clinic visit.  We will plan on rechecking her liver tests at the time of her repeat clinic visit.  Hopefully she will be able to continue to work on losing weight as a means of trying to optimize her blood pressure but also in the setting of underlying fatty liver disease based on her growth on imaging.  We will hold on liver biopsy for now consider in the future.  Weight loss goals seem achievable of a 30 pound weight loss this year and I wished her the best.  All patient questions were answered, to the best of my ability, and the patient agrees to the aforementioned plan of action with follow-up as indicated.   PLAN  Continue Nexium 40 mg daily until end of month Then in March transition to Nexium 20 mg daily and continue Hold on pH impedence testing/manometry Repeat LFTs in 81-months Hold on Liver biopsy Weight loss and HTN control for Fatty Liver optimization and heart health Will consider getting her immunized to HAV & HBV in the future due to Fatty Liver   Orders Placed This Encounter  Procedures  . Hepatic function panel  . INR/PT    New Prescriptions   ESOMEPRAZOLE (NEXIUM) 20 MG CAPSULE    Take 1 capsule (20 mg total) by mouth daily at 12 noon.   Modified Medications   No medications on file    Planned Follow Up: Return in about 3 months (around 01/19/2019).   Corliss Parish, MD [facility] Office # [phone]

## 2018-10-21 NOTE — Patient Instructions (Signed)
  If you are age 43 or younger, your body mass index should be between 19-25. Your Body mass index is 36.69 kg/m. If this is out of the aformentioned range listed, please consider follow up with your Primary Care Provider.   We have sent the following medications to your pharmacy for you to pick up at your convenience: Nexium   Please have labs drawn 1 week prior to your next follow up appointment in 3 months. Lab order has been placed. Go to the lab located in basement of our office.    Thank you for choosing me and [facility].  Dr. Meridee Score

## 2018-10-25 ENCOUNTER — Encounter: Payer: Self-pay | Admitting: Gastroenterology

## 2018-10-25 DIAGNOSIS — K760 Fatty (change of) liver, not elsewhere classified: Secondary | ICD-10-CM | POA: Insufficient documentation

## 2019-01-07 ENCOUNTER — Encounter: Payer: Self-pay | Admitting: Gastroenterology

## 2019-05-08 ENCOUNTER — Other Ambulatory Visit: Payer: Self-pay | Admitting: Gastroenterology

## 2020-04-28 IMAGING — MR MR BRAIN/IAC WO/W
12 of 13 series · 43 of 48 positions shown · IV contrast (multihance)
Comparison: None.

CLINICAL DATA: Sudden hearing loss on the right 3 weeks ago.
Bilateral tinnitus.

EXAM:
MRI HEAD WITHOUT AND WITH CONTRAST
TECHNIQUE: Multiplanar, multiecho pulse sequences of the brain and surrounding
structures were obtained without and with intravenous contrast.
CONTRAST:  19mL MULTIHANCE GADOBENATE DIMEGLUMINE 529 MG/ML IV SOLN

[Series 5: T1 · sagittal · 4.0mm · 0.72mm/px · 2 of 27 slices shown (1 of 3)]
[im 1/27]
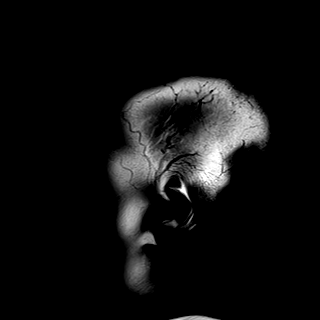
[im 27/27]
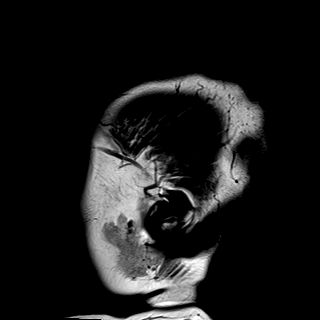

[Series 6: T2 · axial · 4.0mm · 0.36mm/px · z∈[-78,+64]mm · 2 of 29 slices shown]
[im 1/29]
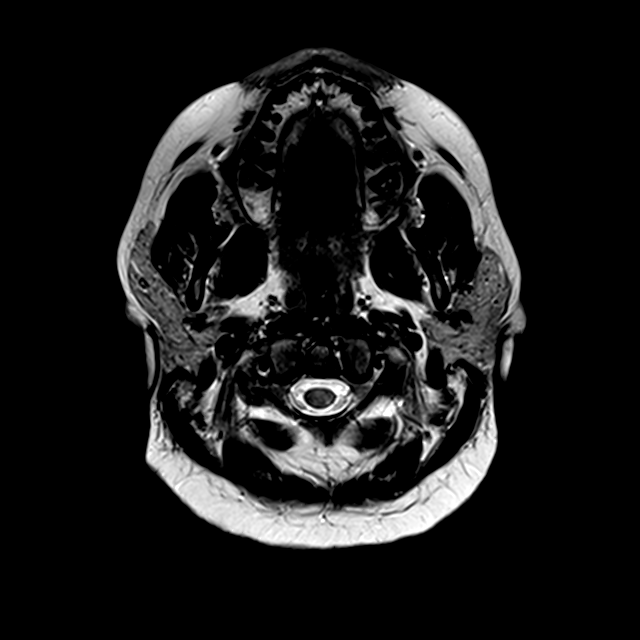
[im 29/29]
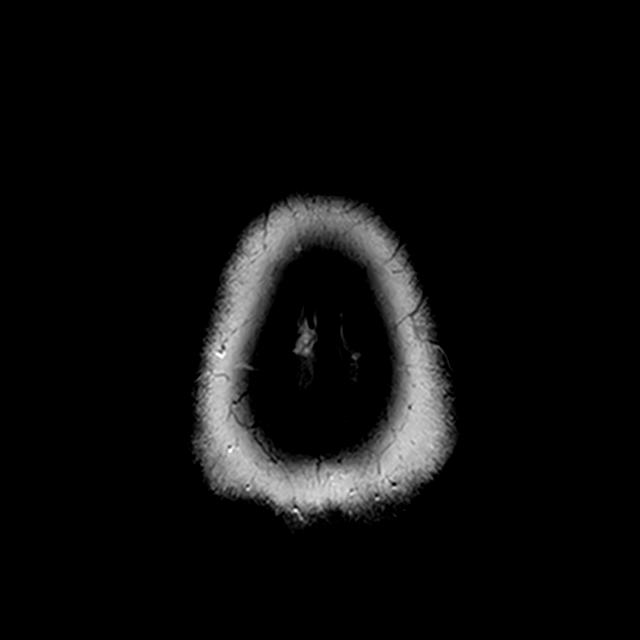

[Series 7: DWI · axial · 3.0mm · 1.44mm/px · z∈[-87,+66]mm · 6 of 82 slices shown (1 of 2)]
[im 1/82]
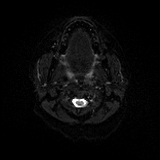
[im 17/82]
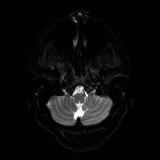
[im 33/82]
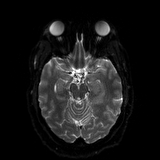
[im 49/82]
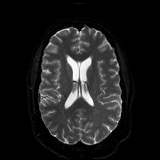
[im 65/82]
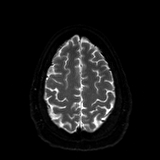
[im 82/82]
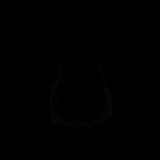

[Series 8: DWI · axial · 3.0mm · 1.44mm/px · z∈[-87,+66]mm · 3 of 41 slices shown (2 of 2)]
[im 1/41]
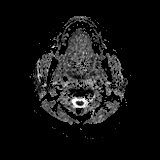
[im 21/41]
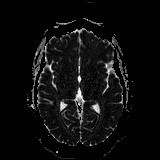
[im 41/41]
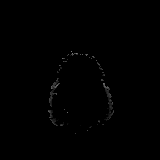

[Series 9: T1 · coronal · 2.5mm · 0.56mm/px · 1 of 13 slices shown (2 of 3)]
[im 1/13]
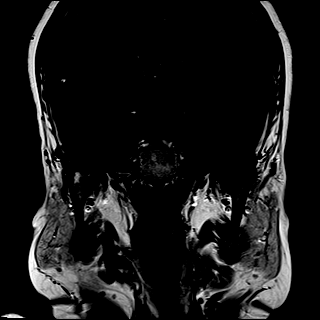

[Series 10: FLAIR · axial · 3.0mm · 0.72mm/px · z∈[-86,+72]mm · 2 of 28 slices shown]
[im 1/28]
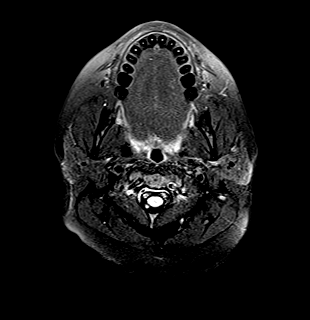
[im 28/28]
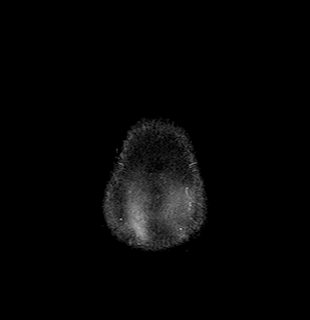

[Series 11: mip_images(sw) · axial · 12.0mm · 0.90mm/px · z∈[-72,+57]mm · 6 of 89 slices shown]
[im 1/89]
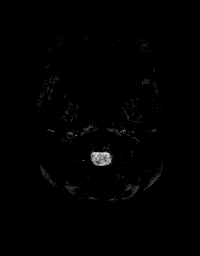
[im 18/89]
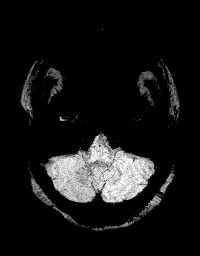
[im 36/89]
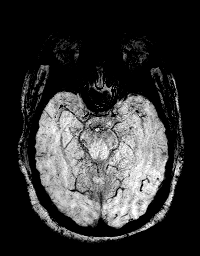
[im 53/89]
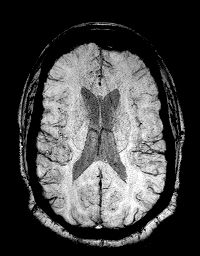
[im 71/89]
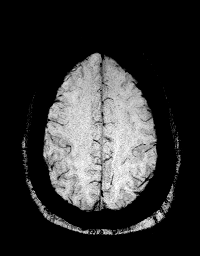
[im 89/89]
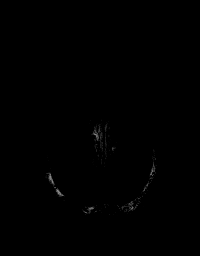

[Series 12: swi_images · axial · 1.5mm · 0.90mm/px · z∈[-78,+62]mm · 7 of 96 slices shown]
[im 1/96]
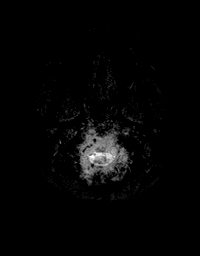
[im 16/96]
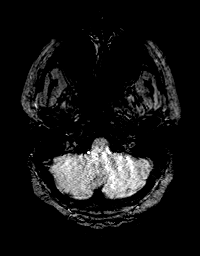
[im 32/96]
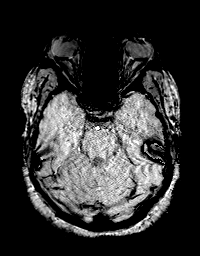
[im 48/96]
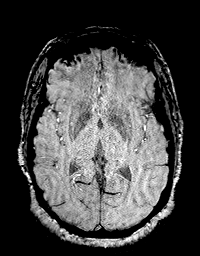
[im 64/96]
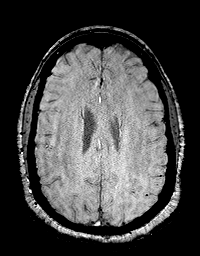
[im 80/96]
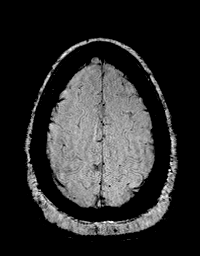
[im 96/96]
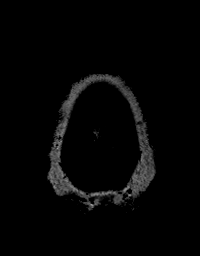

[Series 13: T1 · axial · 2.5mm · 0.50mm/px · 1 of 13 slices shown (3 of 3)]
[im 1/13]
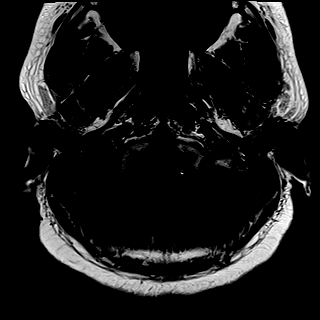

[Series 15: T1 post-contrast · coronal · 2.5mm · 0.56mm/px · 1 of 13 slices shown (1 of 3)]
[im 1/13]
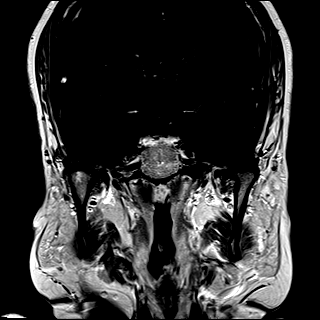

[Series 16: T1 post-contrast · axial · 2.5mm · 0.50mm/px · 1 of 13 slices shown (2 of 3)]
[im 1/13]
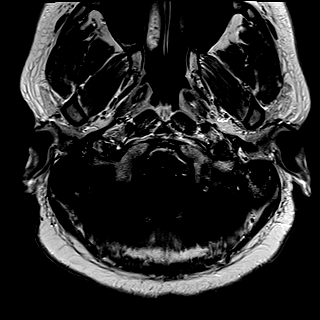

[Series 17: T1 post-contrast · axial · 1.0mm · 0.90mm/px · z∈[-85,+70]mm · 11 of 160 slices shown (3 of 3)]
[im 1/160]
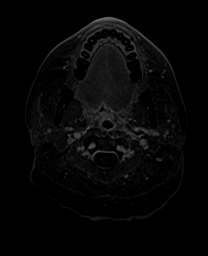
[im 16/160]
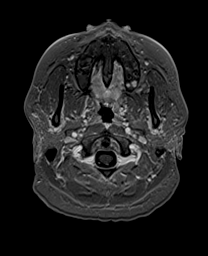
[im 32/160]
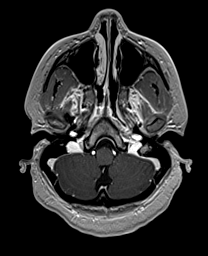
[im 48/160]
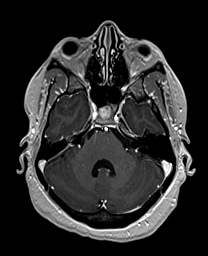
[im 64/160]
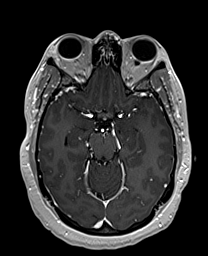
[im 80/160]
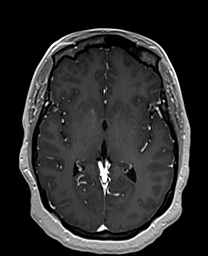
[im 96/160]
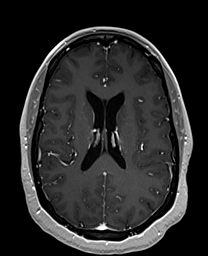
[im 112/160]
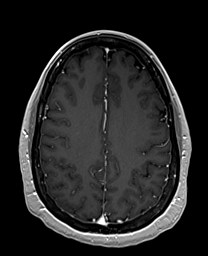
[im 128/160]
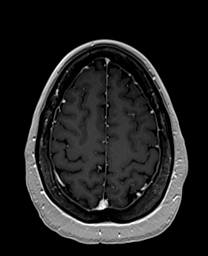
[im 144/160]
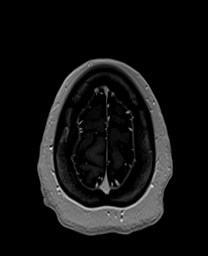
[im 160/160]
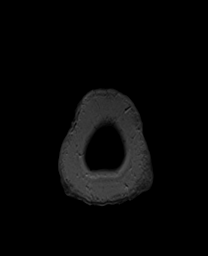

[43 of 48 positions shown; findings below may reference images not displayed]

FINDINGS: Brain: Brain has normal appearance without evidence of malformation,
atrophy, old or acute small or large vessel infarction, mass lesion,
hemorrhage, hydrocephalus or extra-axial collection. CP angle
regions appear normal. No evidence of vestibular schwannoma or
enhancing neuritis. No abnormal enhancement elsewhere within the
brain.

Vascular: Major vessels at the base of the brain show flow. Venous
sinuses appear patent.

Skull and upper cervical spine: Normal.

Sinuses/Orbits: Clear/normal.

Other: None significant.
IMPRESSION: Normal examination. No abnormality seen to explain sudden
sensorineural hearing loss or tinnitus.

## 2023-06-12 ENCOUNTER — Encounter: Payer: Self-pay | Admitting: Gastroenterology

## 2023-08-29 ENCOUNTER — Ambulatory Visit (AMBULATORY_SURGERY_CENTER): Payer: Federal, State, Local not specified - PPO | Admitting: *Deleted

## 2023-08-29 VITALS — Ht 61.0 in | Wt 198.0 lb

## 2023-08-29 DIAGNOSIS — Z1211 Encounter for screening for malignant neoplasm of colon: Secondary | ICD-10-CM

## 2023-08-29 MED ORDER — NA SULFATE-K SULFATE-MG SULF 17.5-3.13-1.6 GM/177ML PO SOLN: 1.0000 | Freq: Once | ORAL | 0 refills | Status: AC

## 2023-08-29 NOTE — Progress Notes (Signed)
Pre visit completed over telephone. Instructions mailed. Patient advised to stop Wegovy 7 days, and to notify us if any changes to health or medications, or ER visits.    No egg or soy allergy known to patient  No issues known to pt with past sedation with any surgeries or procedures Patient denies ever being told they had issues or difficulty with intubation  No FH of Malignant Hyperthermia Pt is not on diet pills Pt is not on  home 02  Pt is not on blood thinners  Pt denies issues with constipation  No A fib or A flutter Have any cardiac testing pending--NO Pt instructed to use [email or web address] or GoodRx for a price reduction on prep

## 2023-09-12 ENCOUNTER — Ambulatory Visit (AMBULATORY_SURGERY_CENTER): Payer: BC Managed Care – PPO | Admitting: Gastroenterology

## 2023-09-12 ENCOUNTER — Encounter: Payer: Self-pay | Admitting: Gastroenterology

## 2023-09-12 VITALS — BP 121/82 | HR 72 | Temp 98.1°F | Resp 14 | Ht 61.0 in | Wt 198.0 lb

## 2023-09-12 DIAGNOSIS — K644 Residual hemorrhoidal skin tags: Secondary | ICD-10-CM

## 2023-09-12 DIAGNOSIS — K621 Rectal polyp: Secondary | ICD-10-CM | POA: Diagnosis not present

## 2023-09-12 DIAGNOSIS — K5730 Diverticulosis of large intestine without perforation or abscess without bleeding: Secondary | ICD-10-CM

## 2023-09-12 DIAGNOSIS — Z1211 Encounter for screening for malignant neoplasm of colon: Secondary | ICD-10-CM

## 2023-09-12 DIAGNOSIS — K641 Second degree hemorrhoids: Secondary | ICD-10-CM | POA: Diagnosis not present

## 2023-09-12 DIAGNOSIS — D128 Benign neoplasm of rectum: Secondary | ICD-10-CM

## 2023-09-12 HISTORY — PX: COLONOSCOPY WITH PROPOFOL: SHX5780

## 2023-09-12 MED ORDER — SODIUM CHLORIDE 0.9 % IV SOLN: 500.0000 mL | INTRAVENOUS | Status: DC

## 2023-09-12 NOTE — Progress Notes (Signed)
 GASTROENTEROLOGY PROCEDURE H&P NOTE   Primary Care Physician: Claudene Round, MD  HPI: Michele [name] is a 48 y.o. female who presents for Colonoscopy for screening.  Past Medical History:  Diagnosis Date   Allergy    Elevated blood pressure reading without diagnosis of hypertension    Hearing loss 03/2018   History of thrush 06/03/2018   Vitamin D  deficiency    Past Surgical History:  Procedure Laterality Date   CESAREAN SECTION  06/09/98 & 04/15/05   x 2   Current Outpatient Medications  Medication Sig Dispense Refill   amLODipine (NORVASC) 5 MG tablet Take 5 mg by mouth daily.     norethindrone (MICRONOR) 0.35 MG tablet Take 1 tablet by mouth daily.     ferrous sulfate  325 (65 FE) MG EC tablet Take 1 tablet by mouth daily.     WEGOVY 0.5 MG/0.5ML SOAJ SMARTSIG:0.5 Milliliter(s) SUB-Q Once a Week     Current Facility-Administered Medications  Medication Dose Route Frequency Provider Last Rate Last Admin   0.9 %  sodium chloride  infusion  500 mL Intravenous Continuous Mansouraty, Macie Baum Jr., MD        Current Outpatient Medications:    amLODipine (NORVASC) 5 MG tablet, Take 5 mg by mouth daily., Disp: , Rfl:    norethindrone (MICRONOR) 0.35 MG tablet, Take 1 tablet by mouth daily., Disp: , Rfl:    ferrous sulfate  325 (65 FE) MG EC tablet, Take 1 tablet by mouth daily., Disp: , Rfl:    WEGOVY 0.5 MG/0.5ML SOAJ, SMARTSIG:0.5 Milliliter(s) SUB-Q Once a Week, Disp: , Rfl:   Current Facility-Administered Medications:    0.9 %  sodium chloride  infusion, 500 mL, Intravenous, Continuous, Mansouraty, Aloha Raddle., MD Allergies  Allergen Reactions   Naproxen Hives   Penicillins Hives, Swelling and Other (See Comments)    Childhood Allergy Has patient had a PCN reaction causing immediate rash, facial/tongue/throat swelling, SOB or lightheadedness with hypotension: Yes Has patient had a PCN reaction causing severe rash involving mucus membranes or skin necrosis: No Has patient  had a PCN reaction that required hospitalization: No Has patient had a PCN reaction occurring within the last 10 years: Unknown If all of the above answers are NO, then may proceed with Cephalosporin use.    Family History  Problem Relation Age of Onset   Hypertension Mother    Hypertension Father    Hypertension Sister    Hypertension Maternal Grandmother    Diabetes Maternal Grandmother    Hypertension Paternal Grandmother    Hypertension Paternal Grandfather    Colon cancer Neg Hx    Esophageal cancer Neg Hx    Inflammatory bowel disease Neg Hx    Liver disease Neg Hx    Pancreatic cancer Neg Hx    Rectal cancer Neg Hx    Stomach cancer Neg Hx    Cancer - Colon Neg Hx    Social History   Socioeconomic History   Marital status: Married    Spouse name: Not on file   Number of children: 2   Years of education: Not on file   Highest education level: Not on file  Occupational History    Employer: [employer]   Tobacco Use   Smoking status: Never   Smokeless tobacco: Never  Vaping Use   Vaping status: Never Used  Substance and Sexual Activity   Alcohol use: Not Currently   Drug use: No   Sexual activity: Yes    Birth control/protection: Pill  Other  Topics Concern   Not on file  Social History Narrative   Caffeine use: sweet tea once a day   regular exercise:  Just joined gym.   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   Married   2 daughters- born 89 and 2006.         Social Drivers of Corporate Investment Banker Strain: Not on file  Food Insecurity: Not on file  Transportation Needs: Not on file  Physical Activity: Not on file  Stress: Not on file  Social Connections: Not on file  Intimate Partner Violence: Not on file    Physical Exam: Today's Vitals   09/12/23 0734 09/12/23 0735  BP: 132/78   Pulse: 87   Temp: 98.1 F (36.7 C) 98.1 F (36.7 C)  SpO2: 99%   Weight: 198 lb (89.8 kg)   Height:  5' 1 (1.549 m)    Body mass index is 37.41 kg/m. GEN: NAD EYE: Sclerae anicteric ENT: MMM CV: Non-tachycardic GI: Soft, NT/ND NEURO:  Alert & Oriented x 3  Lab Results: No results for input(s): WBC, HGB, HCT, PLT in the last 72 hours. BMET No results for input(s): NA, K, CL, CO2, GLUCOSE, BUN, CREATININE, CALCIUM in the last 72 hours. LFT No results for input(s): PROT, ALBUMIN, AST, ALT, ALKPHOS, BILITOT, BILIDIR, IBILI in the last 72 hours. PT/INR No results for input(s): LABPROT, INR in the last 72 hours.   Impression / Plan: This is a 48 y.o.female who presents for Colonoscopy for screening.  The risks and benefits of endoscopic evaluation/treatment were discussed with the patient and/or family; these include but are not limited to the risk of perforation, infection, bleeding, missed lesions, lack of diagnosis, severe illness requiring hospitalization, as well as anesthesia and sedation related illnesses.  The patient's history has been reviewed, patient examined, no change in status, and deemed stable for procedure.  The patient and/or family is agreeable to proceed.    Aloha Finner, MD  Gastroenterology Advanced Endoscopy Office # [phone]

## 2023-09-12 NOTE — Progress Notes (Signed)
 Called to room to assist during endoscopic procedure.  Patient ID and intended procedure confirmed with present staff. Received instructions for my participation in the procedure from the performing physician.

## 2023-09-12 NOTE — Progress Notes (Signed)
 Pt's states no medical or surgical changes since previsit or office visit.

## 2023-09-12 NOTE — Progress Notes (Signed)
 Report to PACU, RN, vss, BBS= Clear.

## 2023-09-12 NOTE — Op Note (Signed)
 [facility] Patient Name: Michele [name] Procedure Date: 09/12/2023 7:13 AM MRN: [id number] Endoscopist: Aloha Finner , MD, [id number] Age: 48 Referring MD:  Date of Birth: 1976/08/07 Gender: Female Account #: [id number] Procedure:                Colonoscopy Indications:              Screening for colorectal malignant neoplasm Medicines:                Monitored Anesthesia Care Procedure:                Pre-Anesthesia Assessment:                           - Prior to the procedure, a History and Physical                            was performed, and patient medications and                            allergies were reviewed. The patient's tolerance of                            previous anesthesia was also reviewed. The risks                            and benefits of the procedure and the sedation                            options and risks were discussed with the patient.                            All questions were answered, and informed consent                            was obtained. Prior Anticoagulants: The patient has                            taken no anticoagulant or antiplatelet agents. ASA                            Grade Assessment: II - A patient with mild systemic                            disease. After reviewing the risks and benefits,                            the patient was deemed in satisfactory condition to                            undergo the procedure.                           After obtaining informed consent, the colonoscope  was passed under direct vision. Throughout the                            procedure, the patient's blood pressure, pulse, and                            oxygen saturations were monitored continuously. The                            Olympus Scope [id number] was introduced through the                            anus and advanced to the 3 cm into the ileum. The                             colonoscopy was performed without difficulty. The                            patient tolerated the procedure. The quality of the                            bowel preparation was good. The terminal ileum,                            ileocecal valve, appendiceal orifice, and rectum                            were photographed. Scope In: 8:03:47 AM Scope Out: 8:15:23 AM Scope Withdrawal Time: 0 hours 9 minutes 48 seconds  Total Procedure Duration: 0 hours 11 minutes 36 seconds  Findings:                 The digital rectal exam findings include                            hemorrhoids. Pertinent negatives include no                            palpable rectal lesions.                           Skin tags were found on perianal exam.                           The terminal ileum and ileocecal valve appeared                            normal.                           A 2 mm polyp was found in the rectum. The polyp was                            sessile. The polyp was removed with a cold snare.  Resection and retrieval were complete.                           Multiple small-mouthed diverticula were found in                            the recto-sigmoid colon and sigmoid colon.                           Normal mucosa was found in the entire colon                            otherwise.                           Non-bleeding non-thrombosed external and internal                            hemorrhoids were found during retroflexion, during                            perianal exam and during digital exam. The                            hemorrhoids were Grade II (internal hemorrhoids                            that prolapse but reduce spontaneously). Complications:            No immediate complications. Estimated Blood Loss:     Estimated blood loss was minimal. Impression:               - Hemorrhoids found on digital rectal exam.                           - Perianal skin tags  found on perianal exam.                           - The examined portion of the ileum was normal.                           - One 2 mm polyp in the rectum, removed with a cold                            snare. Resected and retrieved.                           - Diverticulosis in the recto-sigmoid colon and in                            the sigmoid colon.                           - Normal mucosa in the entire examined colon  otherwise.                           - Non-bleeding non-thrombosed external and internal                            hemorrhoids. Recommendation:           - The patient will be observed post-procedure,                            until all discharge criteria are met.                           - Discharge patient to home.                           - Patient has a contact number available for                            emergencies. The signs and symptoms of potential                            delayed complications were discussed with the                            patient. Return to normal activities tomorrow.                            Written discharge instructions were provided to the                            patient.                           - High fiber diet.                           - Use FiberCon 1-2 tablets PO daily.                           - Continue present medications.                           - Await pathology results.                           - Repeat colonoscopy in 5-10 years for surveillance                            based on pathology results.                           - The findings and recommendations were discussed                            with the patient.                           -  The findings and recommendations were discussed                            with the patient's family. Aloha Finner, MD 09/12/2023 8:20:27 AM

## 2023-09-12 NOTE — Patient Instructions (Signed)
 Handouts Provided:  Polyps, High Fiber Diet and Diverticulosis  Use FiberCon 1-2 tablets by mouth daily.  YOU HAD AN ENDOSCOPIC PROCEDURE TODAY AT THE [facility]:   Refer to the procedure report that was given to you for any specific questions about what was found during the examination.  If the procedure report does not answer your questions, please call your gastroenterologist to clarify.  If you requested that your care partner not be given the details of your procedure findings, then the procedure report has been included in a sealed envelope for you to review at your convenience later.  YOU SHOULD EXPECT: Some feelings of bloating in the abdomen. Passage of more gas than usual.  Walking can help get rid of the air that was put into your GI tract during the procedure and reduce the bloating. If you had a lower endoscopy (such as a colonoscopy or flexible sigmoidoscopy) you may notice spotting of blood in your stool or on the toilet paper. If you underwent a bowel prep for your procedure, you may not have a normal bowel movement for a few days.  Please Note:  You might notice some irritation and congestion in your nose or some drainage.  This is from the oxygen used during your procedure.  There is no need for concern and it should clear up in a day or so.  SYMPTOMS TO REPORT IMMEDIATELY:  Following lower endoscopy (colonoscopy or flexible sigmoidoscopy):  Excessive amounts of blood in the stool  Significant tenderness or worsening of abdominal pains  Swelling of the abdomen that is new, acute  Fever of 100F or higher    For urgent or emergent issues, a gastroenterologist can be reached at any hour by calling [phone]. Do not use MyChart messaging for urgent concerns.    DIET:  We do recommend a small meal at first, but then you may proceed to your regular diet.  Drink plenty of fluids but you should avoid alcoholic beverages for 24 hours.  ACTIVITY:  You should plan  to take it easy for the rest of today and you should NOT DRIVE or use heavy machinery until tomorrow (because of the sedation medicines used during the test).    FOLLOW UP: Our staff will call the number listed on your records the next business day following your procedure.  We will call around 7:15- 8:00 am to check on you and address any questions or concerns that you may have regarding the information given to you following your procedure. If we do not reach you, we will leave a message.     If any biopsies were taken you will be contacted by phone or by letter within the next 1-3 weeks.  Please call us  at [phone] if you have not heard about the biopsies in 3 weeks.    SIGNATURES/CONFIDENTIALITY: You and/or your care partner have signed paperwork which will be entered into your electronic medical record.  These signatures attest to the fact that that the information above on your After Visit Summary has been reviewed and is understood.  Full responsibility of the confidentiality of this discharge information lies with you and/or your care-partner.

## 2023-09-15 ENCOUNTER — Telehealth: Payer: Self-pay

## 2023-09-15 NOTE — Telephone Encounter (Signed)
  Follow up Call-     09/12/2023    7:35 AM  Call back number  Post procedure Call Back phone  # [phone]  Permission to leave phone message Yes     Patient questions:  Do you have a fever, pain , or abdominal swelling? No. Pain Score  0 *  Have you tolerated food without any problems? Yes.    Have you been able to return to your normal activities? Yes.    Do you have any questions about your discharge instructions: Diet   No. Medications  No. Follow up visit  No.  Do you have questions or concerns about your Care? No.  Actions: * If pain score is 4 or above: No action needed, pain <4.

## 2023-09-16 ENCOUNTER — Other Ambulatory Visit: Payer: Self-pay | Admitting: Obstetrics and Gynecology

## 2023-09-16 DIAGNOSIS — Z1231 Encounter for screening mammogram for malignant neoplasm of breast: Secondary | ICD-10-CM

## 2023-09-16 LAB — SURGICAL PATHOLOGY

## 2023-09-17 ENCOUNTER — Encounter: Payer: Self-pay | Admitting: Gastroenterology

## 2023-10-15 ENCOUNTER — Ambulatory Visit
Admission: RE | Admit: 2023-10-15 | Discharge: 2023-10-15 | Disposition: A | Discharge status: Home / Self Care | Payer: Federal, State, Local not specified - PPO | Source: Ambulatory Visit | Attending: Obstetrics and Gynecology | Admitting: Obstetrics and Gynecology

## 2023-10-15 DIAGNOSIS — Z1231 Encounter for screening mammogram for malignant neoplasm of breast: Secondary | ICD-10-CM

## 2023-11-12 NOTE — H&P (Signed)
 Michele [name] is a 48 y.o. female P:2-0-0-2 who presents for a  hysterscopic myomectomy and tubal sterilization because of abnormal uterine bleeding, anemia, submucosal fibroid and the desire to cease childbearing.  For many years the patient was amenorrheic due to the Mirena IUD.  In 2017 however, it was removed using hysteroscopy,  due to it becoming displaced.  Since that time she has taken progestin only oral contraceptives and has noticed an increase in her menstrual flow.  Her 5 days menstrual period requires the change of a super pad,  6 times or more  daily due to clots.  Fortunately her cramping is minimal and does not require analgesia. A pelvic ultrasound 10/09/2023 revealed: anteverted uterus with volume=67 cc: 7.12 x 4.49 x 3.99 cm, endometrium with submucosal fibroid seen on 3-D-1.34 cm and a posterior intramural fibroid-1.59 cm; left ovary-2.72 cm and right ovary-3.18 cm.  Given her menstrual flow pattern and findings on ultrasound, the patient wishes to proceed with submucosal fibroid removal via hysteroscopy.  Additionally the patient is requesting tubal sterililzation because she no longer desires to bear children.   Past Medical History  OB History: G:2;  P: 2-0-0-2;  C-section: 1999 and 2006  GYN History: menarche: 47 YO;   LMP: 10/31/2023;    Contracepton oral contraceptives (estrogen/progesterone)  The patient denies history of sexually transmitted disease.  Denies history of abnormal PAP smear.  Last PAP smear: ASCUS with negative HPV  Medical History: Abnormal Glucose, Anemia, Abnormal Uterine Bleeding, Goiter (negative work-up), Vitamin D Deficiency, Hearing Loss, Submucosal Fibroid, Renal Stone, Overactive Bladder and Hypertension  Surgical History: 1999 C-section; 2006 C-section; 2017 Hysteroscopy (removal of IUD) Denies problems with anesthesia or history of blood transfusions  Family History: Diabetes Mellitus, Asthma and Hypertension  Social History:  Married and employed  by [employer]; Denies tobacco use and occasionally consumes alcohol   Medications: Amloidipine 5 mg daily Ferrous Sulfate 325 mg daily Norethindrone 0.35 mg daily Wegovy 0.5 mg/0.5 mL weekly   Allergies  Allergen Reactions   Naproxen Hives   Penicillins Hives, Swelling and Other (See Comments)    Childhood Allergy Has patient had a PCN reaction causing immediate rash, facial/tongue/throat swelling, SOB or lightheadedness with hypotension: Yes Has patient had a PCN reaction causing severe rash involving mucus membranes or skin necrosis: No Has patient had a PCN reaction that required hospitalization: No Has patient had a PCN reaction occurring within the last 10 years: Unknown If all of the above answers are "NO", then may proceed with Cephalosporin use.     Denies sensitivity to peanuts, shellfish, soy, latex or adhesives. Patient is able to take Ibuprofen and Amoxicillin  ROS:  Admits to sluggish bowel function and decreased hearing but denies corrective lenses, headache, vision changes, nasal congestion, dysphagia, dizziness, hoarseness, cough,  chest pain, shortness of breath, nausea, vomiting, diarrhea  urinary frequency, urgency  dysuria, hematuria, vaginitis symptoms, pelvic pain, swelling of joints,easy bruising,  myalgias, arthralgias, skin rashes, unexplained weight loss and except as is mentioned in the history of present illness, patient's review of systems is otherwise negative.    Physical Exam  Bp:  128/94;    Weight: 197.6 lbs.;   Height: 5'1" ;    BMI: 37.3  Neck: supple without masses or thyromegaly Lungs: clear to auscultation Heart: regular rate and rhythm Abdomen: soft, non-tender and no organomegaly Pelvic:EGBUS- wnl; vagina-normal rugae; uterus-normal size, cervix without lesions or motion tenderness; adnexae-no tenderness or masses Extremities:  no clubbing, cyanosis or edema  Assesment: Abnormal Uterine Bleeding                      Submucosal Fibroid                       Request for Sterilization                      Anemia   Disposition:  A discussion was held with patient regarding the indication for her procedure(s) along with the risks, which include but are not limited to: reaction to anesthesia, damage to adjacent organs (uterus, bowel, bladder), infection and excessive bleeding.  The patient verbalized understanding of these risks and has consented to proceed with Hysteroscopic Removal of Submucosal Fibroid with Myosure and Bilateral Salpingectomy at [facility] on April 3. 2025 @ 8 a.m.  CSN# [id number]   Michele [name] J. Lowell Guitar, PA-C  for Dr. Crist Fat. Rivard

## 2023-11-28 ENCOUNTER — Encounter (HOSPITAL_COMMUNITY): Payer: Self-pay | Admitting: Obstetrics and Gynecology

## 2023-12-02 ENCOUNTER — Encounter (HOSPITAL_COMMUNITY): Payer: Self-pay | Admitting: Obstetrics and Gynecology

## 2023-12-04 ENCOUNTER — Encounter (HOSPITAL_COMMUNITY): Payer: Self-pay | Admitting: Obstetrics and Gynecology

## 2023-12-04 DIAGNOSIS — Z302 Encounter for sterilization: Secondary | ICD-10-CM

## 2023-12-04 DIAGNOSIS — D250 Submucous leiomyoma of uterus: Secondary | ICD-10-CM

## 2023-12-04 NOTE — Pre-Procedure Instructions (Signed)
 SURGICAL INSTRUCTIONS  Your procedure is scheduled on :  Thursday,  12-11-2023.  Report to [facility] Main Entrance "A" at 10:15  A.M., and check in at the Admitting office.  Call this number if you have problems the morning of surgery:  [phone]   Remember:  Do not eat any food after midnight the night before your surgery. Nothing by mouth except clear liquids until 9:15 AM.   At this time  8:45 AM drink ensure pre-surgery carbohydrate drink , needs to be completed at 9:15 AM.  After this time 9:15 AM NO clear liquids including water,  candy,  gum,  and mints.  Your may put ensure drink in the refrigerator.  If you do not like the strawberry drink do not continue to drink it or if it does not agree with you or upsets your stomach stop drinking just drink the clear liquids.   You may drink clear liquids until 9:15 AM the morning of your surgery.   Clear liquids allowed are:  Water Carbonated Beverages Clear Tea (may sweeten, no honey, no mild etc.) Black Coffee Only (may sweeten, NO MILK OR CREAMER OF ANY TYPE ALLOWED) Sports drinks like, Gatorade    Take these medicines the morning of surgery with A SIPS OF WATER :              Amlodipine (norvasc)             Norethinedrone  As of today, STOP taking any Aspirin (unless otherwise instructed by your surgeon) Aleve, Naproxen, Ibuprofen, Motrin, Advil, Goody's, BC's, all herbal medications, fish oil, and all vitamins.           Contacts, glasses, dentures or bridgework may not be worn into surgery.        Patients discharged the day of surgery will not be allowed to drive home, and someone needs to stay with them for 24 hours.    Special instructions:   [place]- Preparing For Surgery  Oral Hygiene is also important to reduce your risk of infection.  Remember - BRUSH YOUR TEETH THE MORNING OF SURGERY WITH YOUR REGULAR TOOTHPASTE  Please follow these instructions carefully.   Shower  MORNING OF SURGERY with DIAL Soap or  any antibacterial soap  Pat yourself dry with a CLEAN TOWEL.        3.   Do not apply lotions, oils, deodorants, (may wear underarm deodorant),  colognes/ perfumes ,  or makeup        4.   Do not wear any jewelry/  piercing's/ metal/  permanent jewelry be removed prior to arrival (this includes plastic jewelry)        5.   Do not wear nail polish, gel polish, artificial nails, or any other type of covering on natural nails (fingers)        6.   Put on clean comfortable clothing        7.   Do no bring valuables to the hospital.  [facility] is not responsible for valuable / personal belongings    Questions were answered. Patient verbalized understanding of instructions.

## 2023-12-04 NOTE — Progress Notes (Signed)
 Spoke w/ via phone for pre-op interview--- pt Lab needs dos----   urine preg      Lab results------ lab appt 12-08-2023 getting CBC/ BMP/ T&S maybe EKG if not received EKG from pt's pcp as requested COVID test -----patient states asymptomatic no test needed Arrive at ------- 1015 on 12-11-2023 NPO after MN NO Solid Food.  Clear liquids from MN until--- 0915 Pre-Surgery Ensure or G2: Ensure ,one ERAS protocol:  yes  Med rec completed Medications to take morning of surgery ----- norvasc, norethindrone Diabetic medication -----  GLP1 agonist last dose:  11-22-2023 GLP1 instructions:  pt stated was given instructions by office for last to be 11-22-2023 and not to do again until after surgery  Patient instructed no nail polish to be worn day of surgery Patient instructed to bring photo id and insurance card day of surgery Patient aware to have Driver (ride ) / caregiver    for 24 hours after surgery - husband, Ambulance person Patient Special Instructions ----- will pick up bag w/ ensure drink and instructions at lab appt Pre-Op special Instructions ----- n/a  Patient verbalized understanding of instructions that were given at this phone interview. Patient denies chest pain, sob, fever, cough at the interview.

## 2023-12-08 ENCOUNTER — Encounter (HOSPITAL_COMMUNITY)
Admission: RE | Admit: 2023-12-08 | Discharge: 2023-12-08 | Disposition: A | Discharge status: Home / Self Care | Source: Ambulatory Visit | Attending: Obstetrics and Gynecology | Admitting: Obstetrics and Gynecology

## 2023-12-08 DIAGNOSIS — N939 Abnormal uterine and vaginal bleeding, unspecified: Secondary | ICD-10-CM | POA: Insufficient documentation

## 2023-12-08 DIAGNOSIS — D250 Submucous leiomyoma of uterus: Secondary | ICD-10-CM | POA: Insufficient documentation

## 2023-12-08 DIAGNOSIS — Z8249 Family history of ischemic heart disease and other diseases of the circulatory system: Secondary | ICD-10-CM | POA: Diagnosis not present

## 2023-12-08 DIAGNOSIS — I10 Essential (primary) hypertension: Secondary | ICD-10-CM | POA: Diagnosis not present

## 2023-12-08 DIAGNOSIS — Z302 Encounter for sterilization: Secondary | ICD-10-CM | POA: Diagnosis present

## 2023-12-08 DIAGNOSIS — Z01818 Encounter for other preprocedural examination: Secondary | ICD-10-CM | POA: Insufficient documentation

## 2023-12-08 LAB — BASIC METABOLIC PANEL WITH GFR
Anion gap: 9 (ref 5–15)
BUN: 14 mg/dL (ref 6–20)
CO2: 25 mmol/L (ref 22–32)
Calcium: 9.4 mg/dL (ref 8.9–10.3)
Chloride: 105 mmol/L (ref 98–111)
Creatinine, Ser: 0.68 mg/dL (ref 0.44–1.00)
GFR, Estimated: >60 mL/min (ref >60)
Glucose, Bld: 91 mg/dL (ref 70–99)
Potassium: 3.3 mmol/L — ABNORMAL LOW (ref 3.5–5.1)
Sodium: 139 mmol/L (ref 135–145)

## 2023-12-08 LAB — TYPE AND SCREEN
ABO/RH(D): O POS
Antibody Screen: NEGATIVE

## 2023-12-08 LAB — CBC
HCT: 35.8 % — ABNORMAL LOW (ref 36.0–46.0)
Hemoglobin: 11.4 g/dL — ABNORMAL LOW (ref 12.0–15.0)
MCH: 28.5 pg (ref 26.0–34.0)
MCHC: 31.8 g/dL (ref 30.0–36.0)
MCV: 89.5 fL (ref 80.0–100.0)
Platelets: 521 10*3/uL — ABNORMAL HIGH (ref 150–400)
RBC: 4 MIL/uL (ref 3.87–5.11)
RDW: 16.1 % — ABNORMAL HIGH (ref 11.5–15.5)
WBC: 10.4 10*3/uL (ref 4.0–10.5)
nRBC: 0 % (ref 0.0–0.2)

## 2023-12-11 ENCOUNTER — Encounter (HOSPITAL_COMMUNITY)
Admission: RE | Disposition: A | Discharge status: Home / Self Care | Payer: Self-pay | Source: Home / Self Care | Attending: Obstetrics and Gynecology

## 2023-12-11 ENCOUNTER — Ambulatory Visit (HOSPITAL_COMMUNITY): Payer: Self-pay | Admitting: Anesthesiology

## 2023-12-11 ENCOUNTER — Other Ambulatory Visit: Payer: Self-pay

## 2023-12-11 ENCOUNTER — Encounter (HOSPITAL_COMMUNITY): Payer: Self-pay | Admitting: Obstetrics and Gynecology

## 2023-12-11 ENCOUNTER — Ambulatory Visit (HOSPITAL_COMMUNITY)
Admission: RE | Admit: 2023-12-11 | Discharge: 2023-12-11 | Disposition: A | Discharge status: Home / Self Care | Payer: Federal, State, Local not specified - PPO | Attending: Obstetrics and Gynecology | Admitting: Obstetrics and Gynecology

## 2023-12-11 DIAGNOSIS — I10 Essential (primary) hypertension: Secondary | ICD-10-CM | POA: Insufficient documentation

## 2023-12-11 DIAGNOSIS — D250 Submucous leiomyoma of uterus: Secondary | ICD-10-CM | POA: Insufficient documentation

## 2023-12-11 DIAGNOSIS — N939 Abnormal uterine and vaginal bleeding, unspecified: Secondary | ICD-10-CM

## 2023-12-11 DIAGNOSIS — Z8249 Family history of ischemic heart disease and other diseases of the circulatory system: Secondary | ICD-10-CM | POA: Insufficient documentation

## 2023-12-11 DIAGNOSIS — Z01818 Encounter for other preprocedural examination: Secondary | ICD-10-CM

## 2023-12-11 DIAGNOSIS — Z302 Encounter for sterilization: Secondary | ICD-10-CM | POA: Insufficient documentation

## 2023-12-11 HISTORY — DX: Diverticulosis of large intestine without perforation or abscess without bleeding: K57.30

## 2023-12-11 HISTORY — PX: LAPAROSCOPIC BILATERAL SALPINGECTOMY: SHX5889

## 2023-12-11 HISTORY — PX: DILATATION & CURETTAGE/HYSTEROSCOPY WITH MYOSURE: SHX6511

## 2023-12-11 HISTORY — DX: Overactive bladder: N32.81

## 2023-12-11 HISTORY — DX: Other constipation: K59.09

## 2023-12-11 HISTORY — DX: Submucous leiomyoma of uterus: D25.0

## 2023-12-11 HISTORY — DX: Essential (primary) hypertension: I10

## 2023-12-11 HISTORY — DX: Abnormal uterine and vaginal bleeding, unspecified: N93.9

## 2023-12-11 HISTORY — DX: Personal history of other diseases of the digestive system: Z87.19

## 2023-12-11 HISTORY — DX: Presence of external hearing-aid: Z97.4

## 2023-12-11 HISTORY — DX: Personal history of urinary calculi: Z87.442

## 2023-12-11 HISTORY — DX: Iron deficiency anemia, unspecified: D50.9

## 2023-12-11 LAB — ABO/RH: ABO/RH(D): O POS

## 2023-12-11 LAB — POCT PREGNANCY, URINE: Preg Test, Ur: NEGATIVE

## 2023-12-11 SURGERY — DILATATION & CURETTAGE/HYSTEROSCOPY WITH MYOSURE
Anesthesia: General | Site: Vagina

## 2023-12-11 MED ORDER — VASOPRESSIN 20 UNIT/ML IV SOLN
INTRAVENOUS | Status: AC
  Filled 2023-12-11: qty 1

## 2023-12-11 MED ORDER — POVIDONE-IODINE 10 % EX SWAB: 2.0000 | Freq: Once | CUTANEOUS | Status: DC

## 2023-12-11 MED ORDER — SODIUM CHLORIDE 0.9 % IR SOLN
Status: DC | PRN
  Administered 2023-12-11: 3000 mL

## 2023-12-11 MED ORDER — DEXAMETHASONE SODIUM PHOSPHATE 10 MG/ML IJ SOLN
INTRAMUSCULAR | Status: DC | PRN
  Administered 2023-12-11: 10 mg via INTRAVENOUS

## 2023-12-11 MED ORDER — ACETAMINOPHEN 500 MG PO TABS
ORAL_TABLET | ORAL | Status: AC
  Filled 2023-12-11: qty 2

## 2023-12-11 MED ORDER — ONDANSETRON HCL 4 MG/2ML IJ SOLN
INTRAMUSCULAR | Status: AC
  Filled 2023-12-11: qty 2

## 2023-12-11 MED ORDER — ENSURE PRE-SURGERY PO LIQD: 296.0000 mL | Freq: Once | ORAL | Status: DC

## 2023-12-11 MED ORDER — FENTANYL CITRATE (PF) 250 MCG/5ML IJ SOLN
INTRAMUSCULAR | Status: AC
  Filled 2023-12-11: qty 5

## 2023-12-11 MED ORDER — OXYCODONE HCL 5 MG PO TABS
5.0000 mg | ORAL_TABLET | Freq: Once | ORAL | Status: AC | PRN
  Administered 2023-12-11: 5 mg via ORAL

## 2023-12-11 MED ORDER — CHLOROPROCAINE HCL 1 % IJ SOLN
INTRAMUSCULAR | Status: AC
  Filled 2023-12-11: qty 30

## 2023-12-11 MED ORDER — ONDANSETRON HCL 4 MG/2ML IJ SOLN: 4.0000 mg | Freq: Once | INTRAMUSCULAR | Status: DC | PRN

## 2023-12-11 MED ORDER — CELECOXIB 200 MG PO CAPS
ORAL_CAPSULE | ORAL | Status: DC
Start: 2023-12-11 — End: 2023-12-11
  Filled 2023-12-11: qty 2

## 2023-12-11 MED ORDER — MIDAZOLAM HCL 2 MG/2ML IJ SOLN
INTRAMUSCULAR | Status: AC
  Filled 2023-12-11: qty 2

## 2023-12-11 MED ORDER — EPHEDRINE SULFATE (PRESSORS) 50 MG/ML IJ SOLN
INTRAMUSCULAR | Status: DC | PRN
  Administered 2023-12-11: 5 mg via INTRAVENOUS

## 2023-12-11 MED ORDER — MEPERIDINE HCL 25 MG/ML IJ SOLN: 6.2500 mg | INTRAMUSCULAR | Status: DC | PRN

## 2023-12-11 MED ORDER — SODIUM CHLORIDE (PF) 0.9 % IJ SOLN
INTRAMUSCULAR | Status: AC
  Filled 2023-12-11: qty 100

## 2023-12-11 MED ORDER — ACETAMINOPHEN 500 MG PO TABS: 1000.0000 mg | ORAL_TABLET | ORAL | Status: DC

## 2023-12-11 MED ORDER — VASOPRESSIN 20 UNIT/ML IV SOLN
INTRAVENOUS | Status: DC | PRN
  Administered 2023-12-11: 1 mL

## 2023-12-11 MED ORDER — LIDOCAINE 2% (20 MG/ML) 5 ML SYRINGE
INTRAMUSCULAR | Status: AC
  Filled 2023-12-11: qty 5

## 2023-12-11 MED ORDER — FENTANYL CITRATE (PF) 100 MCG/2ML IJ SOLN: 25.0000 ug | INTRAMUSCULAR | Status: DC | PRN

## 2023-12-11 MED ORDER — OXYCODONE HCL 5 MG PO TABS
ORAL_TABLET | ORAL | Status: AC
  Filled 2023-12-11: qty 1

## 2023-12-11 MED ORDER — PROPOFOL 10 MG/ML IV BOLUS
INTRAVENOUS | Status: DC | PRN
  Administered 2023-12-11: 150 mg via INTRAVENOUS

## 2023-12-11 MED ORDER — CHLORHEXIDINE GLUCONATE 0.12 % MT SOLN
OROMUCOSAL | Status: AC
  Filled 2023-12-11: qty 15

## 2023-12-11 MED ORDER — ROCURONIUM BROMIDE 10 MG/ML (PF) SYRINGE
PREFILLED_SYRINGE | INTRAVENOUS | Status: AC
Start: 2023-12-11 — End: ?
  Filled 2023-12-11: qty 10

## 2023-12-11 MED ORDER — ROCURONIUM BROMIDE 10 MG/ML (PF) SYRINGE
PREFILLED_SYRINGE | INTRAVENOUS | Status: DC | PRN
  Administered 2023-12-11: 60 mg via INTRAVENOUS

## 2023-12-11 MED ORDER — STERILE WATER FOR IRRIGATION IR SOLN
Status: DC | PRN
  Administered 2023-12-11: 1000 mL

## 2023-12-11 MED ORDER — BUPIVACAINE HCL (PF) 0.25 % IJ SOLN
INTRAMUSCULAR | Status: DC | PRN
  Administered 2023-12-11: 20 mL

## 2023-12-11 MED ORDER — PHENYLEPHRINE 80 MCG/ML (10ML) SYRINGE FOR IV PUSH (FOR BLOOD PRESSURE SUPPORT)
PREFILLED_SYRINGE | INTRAVENOUS | Status: DC | PRN
  Administered 2023-12-11: 160 ug via INTRAVENOUS
  Administered 2023-12-11: 80 ug via INTRAVENOUS
  Administered 2023-12-11: 160 ug via INTRAVENOUS
  Administered 2023-12-11 (×4): 80 ug via INTRAVENOUS
  Administered 2023-12-11: 240 ug via INTRAVENOUS
  Administered 2023-12-11: 160 ug via INTRAVENOUS
  Administered 2023-12-11: 80 ug via INTRAVENOUS

## 2023-12-11 MED ORDER — CHLORHEXIDINE GLUCONATE 0.12 % MT SOLN
15.0000 mL | Freq: Once | OROMUCOSAL | Status: AC
  Administered 2023-12-11: 15 mL via OROMUCOSAL

## 2023-12-11 MED ORDER — LIDOCAINE 2% (20 MG/ML) 5 ML SYRINGE
INTRAMUSCULAR | Status: DC | PRN
  Administered 2023-12-11: 100 mg via INTRAVENOUS

## 2023-12-11 MED ORDER — ORAL CARE MOUTH RINSE: 15.0000 mL | Freq: Once | OROMUCOSAL | Status: AC

## 2023-12-11 MED ORDER — PHENYLEPHRINE 80 MCG/ML (10ML) SYRINGE FOR IV PUSH (FOR BLOOD PRESSURE SUPPORT)
PREFILLED_SYRINGE | INTRAVENOUS | Status: AC
  Filled 2023-12-11: qty 10

## 2023-12-11 MED ORDER — DEXMEDETOMIDINE HCL IN NACL 80 MCG/20ML IV SOLN
INTRAVENOUS | Status: DC | PRN
  Administered 2023-12-11: 12 ug via INTRAVENOUS

## 2023-12-11 MED ORDER — SUGAMMADEX SODIUM 200 MG/2ML IV SOLN
INTRAVENOUS | Status: DC | PRN
  Administered 2023-12-11: 200 mg via INTRAVENOUS

## 2023-12-11 MED ORDER — LACTATED RINGERS IV SOLN: INTRAVENOUS | Status: DC

## 2023-12-11 MED ORDER — OXYCODONE HCL 5 MG/5ML PO SOLN: 5.0000 mg | Freq: Once | ORAL | Status: AC | PRN

## 2023-12-11 MED ORDER — SCOPOLAMINE 1 MG/3DAYS TD PT72
1.0000 | MEDICATED_PATCH | TRANSDERMAL | Status: DC
  Administered 2023-12-11: 1.5 mg via TRANSDERMAL

## 2023-12-11 MED ORDER — MIDAZOLAM HCL 5 MG/5ML IJ SOLN
INTRAMUSCULAR | Status: DC | PRN
  Administered 2023-12-11: 2 mg via INTRAVENOUS

## 2023-12-11 MED ORDER — FENTANYL CITRATE (PF) 100 MCG/2ML IJ SOLN
INTRAMUSCULAR | Status: DC | PRN
Start: 2023-12-11 — End: 2023-12-11
  Administered 2023-12-11: 50 ug via INTRAVENOUS
  Administered 2023-12-11: 100 ug via INTRAVENOUS

## 2023-12-11 MED ORDER — CELECOXIB 200 MG PO CAPS
400.0000 mg | ORAL_CAPSULE | ORAL | Status: AC
  Administered 2023-12-11: 400 mg via ORAL

## 2023-12-11 MED ORDER — EPHEDRINE 5 MG/ML INJ
INTRAVENOUS | Status: AC
  Filled 2023-12-11: qty 10

## 2023-12-11 MED ORDER — ONDANSETRON HCL 4 MG/2ML IJ SOLN
INTRAMUSCULAR | Status: DC | PRN
Start: 2023-12-11 — End: 2023-12-11
  Administered 2023-12-11: 4 mg via INTRAVENOUS

## 2023-12-11 MED ORDER — PROPOFOL 10 MG/ML IV BOLUS
INTRAVENOUS | Status: AC
  Filled 2023-12-11: qty 20

## 2023-12-11 MED ORDER — DEXAMETHASONE SODIUM PHOSPHATE 10 MG/ML IJ SOLN
INTRAMUSCULAR | Status: AC
Start: 2023-12-11 — End: ?
  Filled 2023-12-11: qty 1

## 2023-12-11 MED ORDER — GABAPENTIN 300 MG PO CAPS: 300.0000 mg | ORAL_CAPSULE | ORAL | Status: DC

## 2023-12-11 MED ORDER — CHLOROPROCAINE HCL 1 % IJ SOLN
INTRAMUSCULAR | Status: DC | PRN
  Administered 2023-12-11: 10 mL

## 2023-12-11 MED ORDER — SCOPOLAMINE 1 MG/3DAYS TD PT72
MEDICATED_PATCH | TRANSDERMAL | Status: AC
  Filled 2023-12-11: qty 1

## 2023-12-11 SURGICAL SUPPLY — 53 items
APPLICATOR COTTON TIP 6 STRL (MISCELLANEOUS) ×2 IMPLANT
APPLICATOR COTTON TIP 6IN STRL (MISCELLANEOUS) ×2 IMPLANT
BARRIER ADHS 3X4 INTERCEED (GAUZE/BANDAGES/DRESSINGS) IMPLANT
BOOTIES KNEE HIGH SLOAN (MISCELLANEOUS) ×4 IMPLANT
CABLE HIGH FREQUENCY MONO STRZ (ELECTRODE) IMPLANT
CANISTER SUCT 3000ML PPV (MISCELLANEOUS) ×2 IMPLANT
CATH ROBINSON RED A/P 16FR (CATHETERS) ×2 IMPLANT
DERMABOND ADVANCED .7 DNX12 (GAUZE/BANDAGES/DRESSINGS) ×2 IMPLANT
DERMABOND ADVANCED .7 DNX6 (GAUZE/BANDAGES/DRESSINGS) IMPLANT
DEVICE MYOSURE LITE (MISCELLANEOUS) IMPLANT
DEVICE MYOSURE REACH (MISCELLANEOUS) IMPLANT
DISSECTOR BLUNT TIP ENDO 5MM (MISCELLANEOUS) IMPLANT
DRAPE SURG IRRIG POUCH 19X23 (DRAPES) ×2 IMPLANT
DURAPREP 26ML APPLICATOR (WOUND CARE) ×2 IMPLANT
FORCEPS CUTTING 33CM 5MM (CUTTING FORCEPS) IMPLANT
GLOVE BIOGEL PI IND STRL 7.0 (GLOVE) ×8 IMPLANT
GLOVE ECLIPSE 6.5 STRL STRAW (GLOVE) ×2 IMPLANT
GLOVE SURG UNDER POLY LF SZ7 (GLOVE) ×4 IMPLANT
GOWN STRL REUS W/ TWL LRG LVL3 (GOWN DISPOSABLE) ×4 IMPLANT
IRRIG SUCT STRYKERFLOW 2 WTIP (MISCELLANEOUS) IMPLANT
IRRIGATION SUCT STRKRFLW 2 WTP (MISCELLANEOUS) IMPLANT
KIT PINK PAD W/HEAD ARE REST (MISCELLANEOUS) ×2 IMPLANT
KIT PINK PAD W/HEAD ARM REST (MISCELLANEOUS) ×2 IMPLANT
KIT PROCEDURE FLUENT (KITS) ×2 IMPLANT
KIT TURNOVER KIT B (KITS) ×2 IMPLANT
LIGASURE VESSEL 5MM BLUNT TIP (ELECTROSURGICAL) IMPLANT
NDL ASPIRATION 22 (NEEDLE) IMPLANT
NDL INSUFFLATION 14GA 120MM (NEEDLE) IMPLANT
NEEDLE ASPIRATION 22 (NEEDLE) ×2 IMPLANT
NEEDLE INSUFFLATION 14GA 120MM (NEEDLE) ×2 IMPLANT
PACK LAPAROSCOPY BASIN (CUSTOM PROCEDURE TRAY) ×2 IMPLANT
PACK VAGINAL MINOR WOMEN LF (CUSTOM PROCEDURE TRAY) ×2 IMPLANT
PAD OB MATERNITY 11 LF (PERSONAL CARE ITEMS) ×2 IMPLANT
SCISSORS LAP 5X35 DISP (ENDOMECHANICALS) IMPLANT
SLEEVE Z-THREAD 5X100MM (TROCAR) ×2 IMPLANT
SOL .9 NS 3000ML IRR UROMATIC (IV SOLUTION) IMPLANT
SOL ELECTROSURG ANTI STICK (MISCELLANEOUS) IMPLANT
SOLUTION ELECTROSURG ANTI STCK (MISCELLANEOUS) IMPLANT
SUT MNCRL AB 3-0 PS2 27 (SUTURE) ×2 IMPLANT
SUT VICRYL 0 UR6 27IN ABS (SUTURE) ×4 IMPLANT
SYR 10ML LL (SYRINGE) IMPLANT
SYR 3ML 23GX1 SAFETY (SYRINGE) IMPLANT
SYR 50ML LL SCALE MARK (SYRINGE) IMPLANT
SYS BAG RETRIEVAL 10MM (BASKET) IMPLANT
SYSTEM BAG RETRIEVAL 10MM (BASKET) IMPLANT
TOWEL GREEN STERILE FF (TOWEL DISPOSABLE) ×4 IMPLANT
TRAY FOLEY W/BAG SLVR 14FR (SET/KITS/TRAYS/PACK) ×2 IMPLANT
TROCAR 5MMX150MM (TROCAR) IMPLANT
TROCAR BALLN 12MMX100 BLUNT (TROCAR) ×2 IMPLANT
TROCAR XCEL NON-BLD 5MMX100MML (ENDOMECHANICALS) ×2 IMPLANT
TUBING EVAC SMOKE HEATED PNEUM (TUBING) ×2 IMPLANT
UNDERPAD 30X36 HEAVY ABSORB (UNDERPADS AND DIAPERS) ×2 IMPLANT
WARMER LAPAROSCOPE (MISCELLANEOUS) ×2 IMPLANT

## 2023-12-11 NOTE — Discharge Instructions (Addendum)
 Call [facility] at [phone] if:   You have a temperature greater than or equal to 100.4 degrees Farenheit orally You have pain that is not made better by the pain medication given and taken as directed You have excessive bleeding or problems urinating  Take Colace (Docusate Sodium/Stool Softener) 100 mg 2-3 times daily while taking narcotic pain medicine to avoid constipation or until bowel movements are regular.  Take Ibuprofen 600 mg with food along with Acetaminophen 500 mg (#2 Tablets) every 6 hours for 5 days then as needed for pain  You may drive after 36 hours You may walk up steps  You may shower tomorrow You may resume a regular diet  Keep incisions clean and dry Do not lift over 15 pounds for 2 weeks Avoid anything in vagina  until after your post-operative visit  Post Anesthesia Home Care Instructions  Activity: Get plenty of rest for the remainder of the day. A responsible adult should stay with you for 24 hours following the procedure.  For the next 24 hours, DO NOT: -Drive a car -Advertising copywriter -Drink alcoholic beverages -Take any medication unless instructed by your physician -Make any legal decisions or sign important papers.  Meals: Start with liquid foods such as gelatin or soup. Progress to regular foods as tolerated. Avoid greasy, spicy, heavy foods. If nausea and/or vomiting occur, drink only clear liquids until the nausea and/or vomiting subsides. Call your physician if vomiting continues.  Special Instructions/Symptoms: Your throat may feel dry or sore from the anesthesia or the breathing tube placed in your throat during surgery. If this causes discomfort, gargle with warm salt water. The discomfort should disappear within 24 hours.

## 2023-12-11 NOTE — Anesthesia Preprocedure Evaluation (Addendum)
 Anesthesia Evaluation  Patient identified by MRN, date of birth, ID band Patient awake    Reviewed: Allergy & Precautions, H&P , NPO status , Patient's Chart, lab work & pertinent test results  Airway Mallampati: II  TM Distance: >3 FB Neck ROM: Full    Dental no notable dental hx.    Pulmonary neg pulmonary ROS   Pulmonary exam normal breath sounds clear to auscultation       Cardiovascular Exercise Tolerance: Good hypertension, negative cardio ROS Normal cardiovascular exam Rhythm:Regular Rate:Normal     Neuro/Psych negative neurological ROS  negative psych ROS   GI/Hepatic negative GI ROS, Neg liver ROS,GERD  ,,  Endo/Other  negative endocrine ROS    Renal/GU negative Renal ROS  negative genitourinary   Musculoskeletal negative musculoskeletal ROS (+)    Abdominal   Peds negative pediatric ROS (+)  Hematology negative hematology ROS (+) Blood dyscrasia, anemia   Anesthesia Other Findings   Reproductive/Obstetrics negative OB ROS                             Anesthesia Physical Anesthesia Plan  ASA: 2  Anesthesia Plan: General   Post-op Pain Management: Minimal or no pain anticipated, Tylenol PO (pre-op)* and Celebrex PO (pre-op)*   Induction: Intravenous  PONV Risk Score and Plan: 3 and Ondansetron, Dexamethasone and Treatment may vary due to age or medical condition  Airway Management Planned: Oral ETT  Additional Equipment:   Intra-op Plan:   Post-operative Plan: Extubation in OR  Informed Consent: I have reviewed the patients History and Physical, chart, labs and discussed the procedure including the risks, benefits and alternatives for the proposed anesthesia with the patient or authorized representative who has indicated his/her understanding and acceptance.       Plan Discussed with: Anesthesiologist and CRNA  Anesthesia Plan Comments: (  )         Anesthesia Quick Evaluation

## 2023-12-11 NOTE — Transfer of Care (Signed)
 Immediate Anesthesia Transfer of Care Note  Patient: Michele [name]  Procedure(s) Performed: DILATATION & CURETTAGE/HYSTEROSCOPY WITH MYOSURE (Vagina ) SALPINGECTOMY, BILATERAL, LAPAROSCOPIC (Bilateral: Abdomen)  Patient Location: PACU  Anesthesia Type:General  Level of Consciousness: awake, alert , oriented, and patient cooperative  Airway & Oxygen Therapy: Patient Spontanous Breathing  Post-op Assessment: Report given to RN and Post -op Vital signs reviewed and stable  Post vital signs: Reviewed and stable  Last Vitals:  Vitals Value Taken Time  BP 108/76 12/11/23 1430  Temp 36.7 C 12/11/23 1428  Pulse 85 12/11/23 1431  Resp 19 12/11/23 1431  SpO2 98 % 12/11/23 1431  Vitals shown include unfiled device data.  Last Pain:  Vitals:   12/11/23 1035  TempSrc: Oral  PainSc: 0-No pain      Patients Stated Pain Goal: 6 (12/11/23 1035)  Complications: No notable events documented.

## 2023-12-11 NOTE — Op Note (Signed)
 Preop diagnosis: Uterine submucosal fibroid, desire for sterilization  Postop diagnosis: same  Anesthesia: General  Anesthesiologist: Dr. Miguel Rota  Procedure: Hysteroscopy, myomectomy with Myosure, D&C, laparoscopic bilateral salpyngectomy  Surgeon: Dr. Dois Davenport Keri Tavella  Assistant: Henreitta Leber PA-C  Procedure: After being informed of the planned procedure with possible complications including bleeding, infection,uterine perforation, injury to abdominal organs, need for laparotomy and failure rate of 09/998, informed consent was obtained and patient was taken to or #6.  She was given general anesthesia with endotracheal intubation without complication. She was placed in a lithotomy position, prepped and draped in the sterile fashion and a Foley catheter was inserted in her bladder. Pelvic exam reveals anteverted uterus and 2 normal adnexa..  A speculum is inserted in the vagina. The cervix was grasped with a tenaculum forcep placed on the anterior lip.We proceed with a paracervical block using 1% Nesacaine, 10 cc. Uterus is sounded at 8 cm. The cervix is then easily dilated using Hegar dilator until # 21. This allows for easy placement of a hysteroscope. With perfusion of Normal Saline at a maximum pressure of 90 mmHg, we are able to evaluate the entire uterine cavity.  Observation: 1.5 cm posterior submucosal fibroid. 2 normal tubal ostia. Fine adhesion right fundal.   We proceed with resection of the fibroid using Myosure Reach. We then removed our instrumentation. Using a sharp curette, we proceed with curettage of the endometrial cavity which returns a small amount of normal-appearing endometrium. An Acorn intra-uterine manipulator is placed in the uterus.  :. We infiltrate the umbilical area using 10 cc of Marcaine 0.25 and perform a 5 mm incision which allows for easy insertion of a Verres needle. This allows for easy insufflation of the pneumoperitoneum using warm CO2 at a maximum pressure  of 15 mmHg. A 5 mm trocar is inserted. A  laparoscope is inserted in the abdominal cavity. Two 5 mm trocars are inserted in the lower abdomen under direct visualization,after infiltration each site with 10 cc of Marcaine 0.25%  Observation: Anterior cul-de-sac is normal, posterior cul-de-sac is normal, uterus is normal. Both tubes and ovaries are normal. Appendix is not visualized . There is a right ovarian corpus luteum.   We proceed with bilateral salpyngectomy using Ligasure which allows to cauterize and section the mesosalpynx of each tube from the fimbriae to the uterine insertion. Each tube is detached and removed from the abdomen. Hemostasis is adequate.  All instruments are then removed after evacuating the pneumoperitoneum. Skin is closed with a subcuticular suture of 3-0 Monocryl and Dermabond.  Instrument and sponge count is complete x2. Estimated blood loss is minimal.Water deficit is 300 cc.   The procedure is well tolerated by the patient is taken to recovery room in a well and stable condition.    Specimen: Uterine fibroid fragments, endometrial curettings and 2 tubes to pathology  Esmeralda Arthur  MD

## 2023-12-11 NOTE — Anesthesia Postprocedure Evaluation (Signed)
 Anesthesia Post Note  Patient: Michele [name]  Procedure(s) Performed: DILATATION & CURETTAGE/HYSTEROSCOPY WITH MYOSURE (Vagina ) SALPINGECTOMY, BILATERAL, LAPAROSCOPIC (Bilateral: Abdomen)     Patient location during evaluation: PACU Anesthesia Type: General Level of consciousness: awake and alert Pain management: pain level controlled Vital Signs Assessment: post-procedure vital signs reviewed and stable Respiratory status: spontaneous breathing, nonlabored ventilation, respiratory function stable and patient connected to nasal cannula oxygen Cardiovascular status: blood pressure returned to baseline and stable Postop Assessment: no apparent nausea or vomiting Anesthetic complications: no   No notable events documented.  Last Vitals:  Vitals:   12/11/23 1445 12/11/23 1500  BP: 108/78 110/83  Pulse: 79   Resp: 18 19  Temp:    SpO2: 98% 100%    Last Pain:  Vitals:   12/11/23 1542  TempSrc:   PainSc: 4                  Troup Nation

## 2023-12-11 NOTE — Anesthesia Procedure Notes (Signed)
 Procedure Name: Intubation Date/Time: 12/11/2023 12:43 PM  Performed by: Bishop Limbo, CRNAPre-anesthesia Checklist: Patient identified, Emergency Drugs available, Suction available and Patient being monitored Patient Re-evaluated:Patient Re-evaluated prior to induction Oxygen Delivery Method: Circle System Utilized Preoxygenation: Pre-oxygenation with 100% oxygen Induction Type: IV induction Ventilation: Mask ventilation without difficulty Laryngoscope Size: Mac and 3 Grade View: Grade I Tube type: Oral Tube size: 7.0 mm Number of attempts: 1 Airway Equipment and Method: Stylet Placement Confirmation: ETT inserted through vocal cords under direct vision, positive ETCO2 and breath sounds checked- equal and bilateral Secured at: 22 cm Tube secured with: Tape Dental Injury: Teeth and Oropharynx as per pre-operative assessment

## 2023-12-11 NOTE — Interval H&P Note (Signed)
 History and Physical Interval Note:  12/11/2023 12:00 PM  Michele [name]  has presented today for surgery, with the diagnosis of AUB, SUBMUCOSAL FIBROID, DESIRE FOR STERILIZATION.  The various methods of treatment have been discussed with the patient and family. After consideration of risks, benefits and other options for treatment, the patient has consented to  Procedure(s): DILATATION & CURETTAGE/HYSTEROSCOPY WITH MYOSURE (N/A) SALPINGECTOMY, BILATERAL, LAPAROSCOPIC (Bilateral) as a surgical intervention.  The patient's history has been reviewed, patient examined, no change in status, stable for surgery.  I have reviewed the patient's chart and labs.  Questions were answered to the patient's satisfaction.     Dois Davenport A Cherish Runde

## 2023-12-12 ENCOUNTER — Encounter (HOSPITAL_COMMUNITY): Payer: Self-pay | Admitting: Obstetrics and Gynecology

## 2023-12-15 LAB — SURGICAL PATHOLOGY

## 2024-07-16 ENCOUNTER — Other Ambulatory Visit: Payer: Self-pay | Admitting: Obstetrics and Gynecology

## 2024-07-16 DIAGNOSIS — Z1231 Encounter for screening mammogram for malignant neoplasm of breast: Secondary | ICD-10-CM

## 2024-10-22 ENCOUNTER — Ambulatory Visit
